# Patient Record
Sex: Male | Born: 1995
Health system: Southern US, Community
[De-identification: ages and names within clinical notes are randomized; demographics above are authoritative.]

## PROBLEM LIST (undated history)

## (undated) DIAGNOSIS — I10 Essential (primary) hypertension: Secondary | ICD-10-CM

## (undated) DIAGNOSIS — R569 Unspecified convulsions: Secondary | ICD-10-CM

---
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
## 2015-02-02 ENCOUNTER — Encounter: Payer: Self-pay | Admitting: Family

## 2015-03-02 ENCOUNTER — Other Ambulatory Visit: Payer: Self-pay | Admitting: Pediatrics

## 2015-03-20 ENCOUNTER — Ambulatory Visit: Payer: Managed Care, Other (non HMO) | Admitting: Family

## 2015-04-05 ENCOUNTER — Ambulatory Visit: Payer: Managed Care, Other (non HMO) | Admitting: Family

## 2015-04-17 ENCOUNTER — Ambulatory Visit: Payer: Managed Care, Other (non HMO) | Admitting: Family

## 2015-05-16 ENCOUNTER — Other Ambulatory Visit: Payer: Self-pay | Admitting: Family

## 2015-07-26 ENCOUNTER — Other Ambulatory Visit: Payer: Self-pay | Admitting: Family

## 2015-07-27 ENCOUNTER — Encounter: Payer: Self-pay | Admitting: Family

## 2015-08-07 ENCOUNTER — Ambulatory Visit (INDEPENDENT_AMBULATORY_CARE_PROVIDER_SITE_OTHER): Payer: 59 | Admitting: Family

## 2015-08-07 ENCOUNTER — Encounter: Payer: Self-pay | Admitting: Family

## 2015-08-07 VITALS — BP 120/72 | HR 100 | Ht 69.5 in | Wt 272.0 lb

## 2015-08-07 DIAGNOSIS — E669 Obesity, unspecified: Secondary | ICD-10-CM | POA: Diagnosis not present

## 2015-08-07 DIAGNOSIS — F70 Mild intellectual disabilities: Secondary | ICD-10-CM

## 2015-08-07 DIAGNOSIS — G40209 Localization-related (focal) (partial) symptomatic epilepsy and epileptic syndromes with complex partial seizures, not intractable, without status epilepticus: Secondary | ICD-10-CM

## 2015-08-07 DIAGNOSIS — R269 Unspecified abnormalities of gait and mobility: Secondary | ICD-10-CM

## 2015-08-07 NOTE — Progress Notes (Signed)
Patient: Isaiah Knapp MRN: [id number] Sex: male DOB: [birth year]  Provider: Elveria Rising, NP Location of Care: [facility]  Note type: Routine return visit  History of Present Illness: Referral Source: Hassell Halim, MD History from: mother, patient and [facility] chart Chief Complaint: Seizures  Isaiah Knapp is a 20 y.o. young man with history of prematurity, closed head injury at the age of 4 years and seizures. He was last seen July 14, 2014. Isaiah Knapp is taking and tolerating Levetiracetam for his seizure disorder. In the past, Isaiah Knapp was taking Carbatrol, but he was experiencing break through seizures despite having therapeutic Carbamazepine levels. He was transitioned to Levetiracetam and tapered slowly off Carbatrol. He had some breakthrough seizures in February 2015 and the Levetiracetam dose was increased.Cleophus's mother tells me today that he had a seizure on June 22, 2015 while on vacation in [place]. Mom said that the family got up at 3AM to drive to [place], and that she thought that he was having some "mini-seizures" on the way, but thought that when he arrived and was out of the car would feel better. She said that they drove for 10-11 hours. Isaiah Knapp seemed tired but excited to be with his family in [place]. Later that day, he had a greater than 3 minute seizure while watching his cousins play a video game. He was taken to a local ER for evaluation. Fortunately he was not injured during the course of the seizure. A CBC was performed in the ER, and Mom was told that the seizure likely occurred as a result of sleep deprivation. He went to his family's home, slept and did well for the remainder of the trip.   Isaiah Knapp has been otherwise healthy since last seen. He has continued to gain weight and is becoming quite obese. He is enrolled in the occupational course of study at school and doing well.   Neither Isaiah Knapp nor his mother have other health concerns for him today  other than previously mentioned.  Review of Systems: Please see the HPI for neurologic and other pertinent review of systems. Otherwise, the following systems are noncontributory including constitutional, eyes, ears, nose and throat, cardiovascular, respiratory, gastrointestinal, genitourinary, musculoskeletal, skin, endocrine, hematologic/lymph, allergic/immunologic and psychiatric.   Past Medical History  Diagnosis Date  . Seizure (HCC) 2001    from head trauma   Hospitalizations: Yes.  , Head Injury: No., Nervous System Infections: No., Immunizations up to date: Yes.   Past Medical History Comments: Patient was in NICU for 3 months after birth due to being born at 54 weeks. He suffered a head injury at the age of 4 jumping from a bed falling off and hitting his head on the bed frame, he was treated at [facility] in [place]. Patient suffered a head injury at the age of 20 years old from a fall and stayed for 3 to 4 days at [facility] in [place]. Records from [facility] in [place], [place] were reviewed. They mention that for the most part seizure control was good. He was prescribed both Tegretol and Carbatrol. He was noted to have developmental delay. Mention was made that he was followed by neurology. He had mild hyperreflexia in his lower extremities including ankle clonus he had tight Achilles tendons. EEG performed February 10, 2003 showed frequent independent and synchronous frontal polar spike and spike and wave discharges at times occurring on multiple occasions throughout each 10 second epoch Predominance shifted between hemispheres. At times they spread over  the paracentral regions posteriorly right greater than left. Background activity appeared otherwise normal. There were no photoparoxysmal response was. Seizure activity was more prominent when the patient was asleep than awake.  EEG performed July 31, 2011 with a normal record with the patient awake. He tapered  off Carbatrol in November 2014 after Levetiracetam was added to his regimen.   Surgical History No past surgical history on file.  Family History family history includes Breast cancer in his paternal grandmother; Heart attack in his maternal grandfather and paternal grandfather. Family History is otherwise negative for migraines, seizures, cognitive impairment, blindness, deafness, birth defects, chromosomal disorder, autism.  Social History Social History   Social History  . Marital Status: Single    Spouse Name: N/A  . Number of Children: N/A  . Years of Education: N/A   Social History Main Topics  . Smoking status: Never Smoker   . Smokeless tobacco: Never Used  . Alcohol Use: No  . Drug Use: No  . Sexual Activity: No   Other Topics Concern  . 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 He lives with his mother. He enjoys puzzles, lego building, and watching TV.    Allergies No Known Allergies  Physical Exam BP 120/72 mmHg  Pulse 100  Ht 5' 9.5" (1.765 m)  Wt 272 lb (123.378 kg)  BMI 39.60 kg/m2 General: alert, well developed, well nourished obese young man, in no acute distress, right-handed  Head: normocephalic, no dysmorphic features  Ears, Nose and Throat: Otoscopic: tympanic membranes normal . Pharynx: oropharynx is pink without exudates or tonsillar hypertrophy.  Neck: supple, full range of motion, no cranial or cervical bruits  Respiratory: auscultation clear  Cardiovascular: no murmurs, pulses are normal  Musculoskeletal: no apparent scoliosis; patient had tight heel cords bilaterally Skin: no rashes or neurocutaneous lesions   Neurologic Exam  Mental Status: alert; oriented to person, place, and year; knowledge is subnormal for age; language is normal.  Cranial Nerves: visual fields are full to double simultaneous stimuli; extraocular movements are  full and conjugate; pupils are round reactive to light; funduscopic examination shows sharp disc margins with normal vessels; symmetric facial strength; midline tongue and uvula; hearing is intact and symmetric  Motor: Normal strength, tone, and mass; good fine motor movements; no pronator drift.  Sensory: intact responses to touch and temperature  Coordination: good finger-to-nose, rapid repetitive alternating movements and finger apposition  Gait and Station: gait was slightly broad-based, valgus position of his feet; patient is able to walk on his toes better than his heels and tandem with mild difficulty; balance is poor; Romberg exam is negative Reflexes: symmetric and diminished bilaterally; no clonus; bilateral flexor plantar responses.   Impression 1. Complex partial seizures with secondary generalization 2. Mild intellectual disability 3. Abnormal gait 4. History of prematurity 5. History of closed head injury at age 23 years 6. Morbid obesity  Recommendations for plan of care The patient's previous [facility] records were reviewed. Rubel has neither had nor required imaging or lab studies since the last visit, other than a CBC at a hospital in [place] in November when he had a seizure while on vacation. He is a 20 year old young man with history of prematurity, closed head injury at 20 years of age and seizures. He is taking and tolerating Levetiracetam. He had a seizure in November while on vacation and in the setting of  sleep deprivation. I talked with Mom about that and will not increase his dose of Levetiracetam at this time. I asked Mom to let me know if he has any more seizures and I will reconsider this decision.   I continue to be concerned about Kelly's ongoing weight gain. I talked with Mom about his weight gain and my concerns about him developing chronic medical problems related to obesity. It is difficult to fully monitor his intake as he is fairly independent in activities of  daily living.  We talked about ways to limit his access to snacks and second portions, as well as increasing his activity.  Wt Readings from Last 3 Encounters:  08/07/15 272 lb (123.378 kg) (100 %*, Z = 2.71)  07/14/14 264 lb 12.8 oz (120.112 kg) (100 %*, Z = 2.63)  11/01/13 246 lb 6.4 oz (111.766 kg) (99 %*, Z = 2.41)   * Growth percentiles are based on CDC 2-20 Years data.   I will see [name] back in follow up in 1 year or sooner if needed. Mom agreed with the plans made today.   The medication list was reviewed and reconciled.  No changes were made in the prescribed medications today.  A complete medication list was provided to the patient's mother.  Total time spent with the patient was 20 minutes, of which 50% or more was spent in counseling and coordination of care.

## 2015-08-09 MED ORDER — LEVETIRACETAM 100 MG/ML PO SOLN: ORAL | Status: DC

## 2015-08-09 NOTE — Patient Instructions (Signed)
Isaiah Knapp should continue taking Levetiracetam 9036m in the morning and 36ml at night.   Let me know if he has any seizures.   Isaiah Knapp has gained considerable weight. He needs to limit snacks and portions, and be encouraged to be more active. I am concerned about him developing conditions such as diabetes, high cholesterol and high blood pressure, all of which can be related to excessive weight. I have printed his last 3 weights for you to see: Wt Readings from Last 3 Encounters:  08/07/15 272 lb (123.378 kg) (100 %*, Z = 2.71)  07/14/14 264 lb 12.8 oz (120.112 kg) (100 %*, Z = 2.63)  11/01/13 246 lb 6.4 oz (111.766 kg) (99 %*, Z = 2.41)   * Growth percentiles are based on CDC 2-20 Years data.    Please plan to return for follow up in 1 year or sooner if needed.

## 2015-11-08 ENCOUNTER — Telehealth: Payer: Self-pay

## 2015-11-08 DIAGNOSIS — G40209 Localization-related (focal) (partial) symptomatic epilepsy and epileptic syndromes with complex partial seizures, not intractable, without status epilepticus: Secondary | ICD-10-CM

## 2015-11-08 MED ORDER — LEVETIRACETAM 100 MG/ML PO SOLN: ORAL | Status: DC

## 2015-11-08 NOTE — Telephone Encounter (Signed)
Rx sent electronically. TG 

## 2015-11-08 NOTE — Telephone Encounter (Signed)
Mom called and lvm requesting Rx refill to be sent to the pharmacy. CB# [phone]  Lvm for mom letting her know that we received her request for refill on child's levetiracetam 100 mg/mL sig; Take 10 mL po q am and 10 mL po q pm, to be sent to [facility] on [address]. In the message I told he rot check with the pharmacy later today for the refill.

## 2016-08-01 ENCOUNTER — Ambulatory Visit (INDEPENDENT_AMBULATORY_CARE_PROVIDER_SITE_OTHER): Payer: 59 | Admitting: Family

## 2016-08-01 ENCOUNTER — Encounter (INDEPENDENT_AMBULATORY_CARE_PROVIDER_SITE_OTHER): Payer: Self-pay | Admitting: Family

## 2016-08-01 VITALS — BP 126/74 | HR 84 | Ht 69.75 in | Wt 275.2 lb

## 2016-08-01 DIAGNOSIS — R269 Unspecified abnormalities of gait and mobility: Secondary | ICD-10-CM | POA: Diagnosis not present

## 2016-08-01 DIAGNOSIS — E6609 Other obesity due to excess calories: Secondary | ICD-10-CM | POA: Diagnosis not present

## 2016-08-01 DIAGNOSIS — G40209 Localization-related (focal) (partial) symptomatic epilepsy and epileptic syndromes with complex partial seizures, not intractable, without status epilepticus: Secondary | ICD-10-CM

## 2016-08-01 DIAGNOSIS — F70 Mild intellectual disabilities: Secondary | ICD-10-CM

## 2016-08-01 MED ORDER — LEVETIRACETAM 100 MG/ML PO SOLN: ORAL | 3 refills | Status: DC

## 2016-08-01 NOTE — Patient Instructions (Signed)
I have sent in a new prescription to the pharmacy for the Levetiracetam with new instructions to take 12ml in the morning and 12ml at night. Let me know if [name] has any more seizures.   I have also given you a sample of the new "melt in the mouth" version of Levetiracetam called Spritam. Nilda SimmerGive Seanmichael 2 of the 500mg  and 1 of the 250mg  tablets at bedtime tonight to see if he likes it better than the liquid Levetiracetam. If he likes it better, we can send in a new prescription the next time he needs a refill.   Please plan on returning for follow up in 1 year or sooner if needed.

## 2016-08-01 NOTE — Progress Notes (Signed)
Patient: Isaiah Knapp MRN: [id number] Sex: male DOB: Mar 16, 1996  Provider: Elveria Rising, NP Location of Care: [facility]  Note type: Routine return visit  History of Present Illness: Referral Source: Kandyce Rud, MD History from: patient, [facility] chart and mother Chief Complaint: Epilepsy  Isaiah Knapp is a 21 y.o. boy with history of prematurity, closed head injury at the age of 4 years and seizures. He was last seen August 07, 2015.  Isaiah Knapp is taking and tolerating Levetiracetam for his seizure disorder. In the past, Isaiah Knapp was taking Carbatrol, but he was experiencing break through seizures despite having therapeutic Carbamazepine levels. He was transitioned to Levetiracetam and tapered slowly off Carbatrol. Mom tells me today that Isaiah Knapp was doing well until mid-December when he had a convulsive 3 minute seizure after 2 days of missed medication. The medication was restarted and he did well until January 2nd when he had 2 brief episodes of partial seizures. Mom said that he had not missed any medication prior to the January 2nd seizures and that he had not been sleep deprived.   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 He is doing well in this role.   Isaiah Knapp has been otherwise healthy since last seen. He has continued to gain weight and is quite obese. Neither Kamar nor his mother have other health concerns for him today other than previously mentioned.  Review of Systems: Please see the HPI for neurologic and other pertinent review of systems. Otherwise, the following systems are noncontributory including constitutional, eyes, ears, nose and throat, cardiovascular, respiratory, gastrointestinal, genitourinary, musculoskeletal, skin, endocrine, hematologic/lymph, allergic/immunologic and psychiatric.   Past Medical History:  Diagnosis Date  . Seizure (HCC) 2001   from head trauma   Hospitalizations: No., Head Injury:  No., Nervous System Infections: No., Immunizations up to date: Yes.   Past Medical History Comments: Patient was in NICU for 3 months after birth due to being born at 52 weeks. He suffered a head injury at the age of 4 jumping from a bed falling off and hitting his head on the bed frame, he was treated at [facility] in [place]. Patient suffered a head injury at the age of 21 years old from a fall and stayed for 3 to 4 days at [facility] in [place]. Records from [facility] in [place], [place] were reviewed. They mention that for the most part seizure control was good. He was prescribed both Tegretol and Carbatrol. He was noted to have developmental delay. Mention was made that he was followed by neurology. He had mild hyperreflexia in his lower extremities including ankle clonus he had tight Achilles tendons. EEG performed February 10, 2003 showed frequent independent and synchronous frontal polar spike and spike and wave discharges at times occurring on multiple occasions throughout each 10 second epoch Predominance shifted between hemispheres. At times they spread over the paracentral regions posteriorly right greater than left. Background activity appeared otherwise normal. There were no photoparoxysmal response was. Seizure activity was more prominent when the patient was asleep than awake.  EEG performed July 31, 2011 with a normal record with the patient awake. He tapered off Carbatrol in November 2014 after Levetiracetam was added to his regimen.   Surgical History History reviewed. No pertinent surgical history.  Family History family history includes Breast cancer in his paternal grandmother; Heart attack in his maternal grandfather and paternal grandfather. Family History is otherwise negative for migraines,  seizures, cognitive impairment, blindness, deafness, birth defects, chromosomal disorder, autism.  Social History Social History   Social History  . Marital  status: Single    Spouse name: N/A  . Number of children: N/A  . Years of education: N/A   Social History Main Topics  . Smoking status: Never Smoker  . Smokeless tobacco: Never Used  . Alcohol use No  . Drug use: No  . Sexual activity: No   Other Topics Concern  . None   Social History Narrative   Isaiah Knapp graduated from [employer] (internship); he does well in both. He is employed at [employer] as a Public affairs consultant. He lives with his mother. He enjoys puzzles, lego building, and watching TV.    Allergies No Known Allergies  Physical Exam BP 126/74   Pulse 84   Ht 5' 9.75" (1.772 m)   Wt 275 lb 3.2 oz (124.8 kg)   BMI 39.77 kg/m  General: alert, well developed, well nourished obese young man, in no acute distress, right-handed  Head: normocephalic, no dysmorphic features  Ears, Nose and Throat: Otoscopic: tympanic membranes normal . Pharynx: oropharynx is pink without exudates or tonsillar hypertrophy.  Neck: supple, full range of motion, no cranial or cervical bruits  Respiratory: auscultation clear  Cardiovascular: no murmurs, pulses are normal  Musculoskeletal: no apparent scoliosis; patient had tight heel cords bilaterally Skin: no rashes or neurocutaneous lesions   Neurologic Exam  Mental Status: alert; oriented to person, place, and year; knowledge is subnormal for age; language is normal but he has difficulty answering questions and looks to his mother for help Cranial Nerves: visual fields are full to double simultaneous stimuli; extraocular movements are full and conjugate; pupils are round reactive to light; funduscopic examination shows sharp disc margins with normal vessels; symmetric facial strength; midline tongue and uvula; hearing is intact and symmetric  Motor: Normal strength, tone, and mass; good fine motor movements; no pronator drift.  Sensory: intact responses to touch and temperature  Coordination: good finger-to-nose,  rapid repetitive alternating movements and finger apposition  Gait and Station: gait was slightly broad-based, valgus position of his feet; patient is able to walk on his toes better than his heels and tandem with mild difficulty; balance is poor; Romberg exam is negative Reflexes: symmetric and diminished bilaterally; no clonus; bilateral flexor plantar responses.   Impression 1. Complex partial seizures with secondary generalization 2. Mild intellectual disability 3. Abnormal gait 4. History of prematurity 5. History of closed head injury at age 37 years 6. Morbid obesity  Recommendations for plan of care The patient's previous [facility] records were reviewed. Isaiah Knapp has neither had nor required imaging or lab studies since the last visit. He is a 21 year old young man with history of prematurity, closed head injury at 21 years of age and seizures. He is taking and tolerating Levetiracetam. He had a convulsive seizure in December in the setting of missed medication and 2 partial seizures this week with no obvious triggers. I talked with Mom and recommended increasing the Levetiracetam dose. I will increase his dose from 10ml BID to 12ml BID. I also talked with her about trying a sample of Spritam, the orally dissolvable Levetiracetam to see if that will be easier for Isaiah Knapp since he takes a large dose of the liquid suspension. I gave Mom a sample of the Spritam and asked her to let me know if he liked it better than the liquid. I also asked her to let me  know if he has any more seizures.   I continue to be concerned about Isaiah Knapp's ongoing weight gain. I talked with Mom about his weight gain and my concerns about him developing chronic medical problems related to obesity. It is difficult to fully monitor his intake as he is fairly independent in activities of daily living.  We talked about ways to limit his access to snacks and second portions, as well as increasing his activity.  Wt Readings from Last 3  Encounters:  08/01/16 275 lb 3.2 oz (124.8 kg)  08/07/15 272 lb (123.4 kg) (>99 %, Z > 2.33)*  07/14/14 264 lb 12.8 oz (120.1 kg) (>99 %, Z > 2.33)*   * Growth percentiles are based on CDC 2-20 Years data.    The medication list was reviewed and reconciled.  No changes were made in the prescribed medications today.  A complete medication list was provided to the patient/caregiver. I will see [name] back in follow up in 1 year or sooner if needed.   Allergies as of 08/01/2016   No Known Allergies     Medication List       Accurate as of 08/01/16 11:59 PM. Always use your most recent med list.          levETIRAcetam 100 MG/ML solution Commonly known as:  KEPPRA TAKE 12 ML IN THE MORNING AND 12 ML IN THE EVENING       Dr. Sharene SkeansHickling was consulted regarding the patient.   Total time spent with the patient was 30 minutes, of which 50% or more was spent in counseling and coordination of care.   Elveria Risingina Dewitte Vannice NP-C

## 2016-08-06 ENCOUNTER — Telehealth (INDEPENDENT_AMBULATORY_CARE_PROVIDER_SITE_OTHER): Payer: Self-pay | Admitting: Family

## 2016-08-06 DIAGNOSIS — G40209 Localization-related (focal) (partial) symptomatic epilepsy and epileptic syndromes with complex partial seizures, not intractable, without status epilepticus: Secondary | ICD-10-CM

## 2016-08-06 MED ORDER — SPRITAM 250 MG PO TB3D: 1.0000 | ORAL_TABLET | Freq: Two times a day (BID) | ORAL | 5 refills | Status: DC

## 2016-08-06 MED ORDER — SPRITAM 1000 MG PO TB3D: 1.0000 | ORAL_TABLET | Freq: Two times a day (BID) | ORAL | 5 refills | Status: DC

## 2016-08-06 NOTE — Telephone Encounter (Signed)
Mom Arnoldo HookerKarla Francois called to say that [name] liked the Spritam sample better than the Levetiracetam suspension and wanted to get a prescription of the medication sent to the pharmacy. I sent in a prescription as requested. TG

## 2017-05-13 DIAGNOSIS — Z0100 Encounter for examination of eyes and vision without abnormal findings: Secondary | ICD-10-CM | POA: Diagnosis not present

## 2017-08-13 ENCOUNTER — Other Ambulatory Visit (INDEPENDENT_AMBULATORY_CARE_PROVIDER_SITE_OTHER): Payer: Self-pay | Admitting: Family

## 2017-08-13 DIAGNOSIS — G40209 Localization-related (focal) (partial) symptomatic epilepsy and epileptic syndromes with complex partial seizures, not intractable, without status epilepticus: Secondary | ICD-10-CM

## 2017-08-18 ENCOUNTER — Other Ambulatory Visit (INDEPENDENT_AMBULATORY_CARE_PROVIDER_SITE_OTHER): Payer: Self-pay | Admitting: Family

## 2017-08-18 DIAGNOSIS — G40209 Localization-related (focal) (partial) symptomatic epilepsy and epileptic syndromes with complex partial seizures, not intractable, without status epilepticus: Secondary | ICD-10-CM

## 2017-08-18 MED ORDER — KEPPRA 100 MG/ML PO SOLN: ORAL | 5 refills | Status: DC

## 2017-08-18 MED ORDER — SPRITAM 250 MG PO TB3D: 1.0000 | ORAL_TABLET | Freq: Two times a day (BID) | ORAL | 5 refills | Status: DC

## 2017-08-18 MED ORDER — SPRITAM 1000 MG PO TB3D: 1.0000 | ORAL_TABLET | Freq: Two times a day (BID) | ORAL | 5 refills | Status: DC

## 2017-08-18 NOTE — Telephone Encounter (Signed)
Rx has been printed and awaiting Tina's signature

## 2017-08-18 NOTE — Telephone Encounter (Signed)
Mother called and stated that the pharmacy did not carry the Spritam and they have decided to go back to the Leveiracetam suspension.  Please send in Leveiracetam to the pharmacy.

## 2017-08-18 NOTE — Telephone Encounter (Signed)
L/M informing mom that the prescriptions have been faxed over to the pharmacy

## 2017-08-18 NOTE — Telephone Encounter (Signed)
Spoke with Isaiah Knapp and she stated that she will send in the rx

## 2017-08-18 NOTE — Addendum Note (Signed)
Addended by: Princella IonGOODPASTURE, Trell Secrist P on: 08/18/2017 04:18 PM   Modules accepted: Orders

## 2017-08-18 NOTE — Telephone Encounter (Signed)
°  Who's calling (name and relationship to patient) : Mom/Karla  Best contact number: [phone]  Provider they see: Sula Sodaina G.  Reason for call: Mom called to sched f/u appt for 08/21/17, also stated that pt only has one dose left, needs refill as soon as possible please. Requested a call to confirm rx is sent to pharmacy please.      PRESCRIPTION REFILL ONLY  Name of prescription: LEVETIRACETAM  [facility] [address]

## 2017-08-20 ENCOUNTER — Encounter (INDEPENDENT_AMBULATORY_CARE_PROVIDER_SITE_OTHER): Payer: Self-pay | Admitting: Family

## 2017-08-20 ENCOUNTER — Ambulatory Visit (INDEPENDENT_AMBULATORY_CARE_PROVIDER_SITE_OTHER): Payer: 59 | Admitting: Family

## 2017-08-20 VITALS — BP 124/100 | HR 76 | Ht 70.0 in | Wt 287.6 lb

## 2017-08-20 DIAGNOSIS — G40209 Localization-related (focal) (partial) symptomatic epilepsy and epileptic syndromes with complex partial seizures, not intractable, without status epilepticus: Secondary | ICD-10-CM | POA: Diagnosis not present

## 2017-08-20 DIAGNOSIS — R030 Elevated blood-pressure reading, without diagnosis of hypertension: Secondary | ICD-10-CM

## 2017-08-20 MED ORDER — KEPPRA 100 MG/ML PO SOLN: ORAL | 5 refills | Status: DC

## 2017-08-20 NOTE — Patient Instructions (Signed)
Thank you for coming in today.   Instructions for you until your next appointment are as follows: 1. I have ordered an EEG to evaluate the seizures. I will call you when the EEG has been read and we will talk about a treatment plan at that time.  2. Increase the Keppra to 13 ml in the morning and 13ml in the evening.  3. Tannar's BP is higher than expected today on the bottom number (called diastolic). Please follow up with his PCP about this.  4. Please sign up for MyChart if you have not done so

## 2017-08-20 NOTE — Progress Notes (Signed)
Patient: Isaiah Knapp MRN: [id number] Sex: male DOB: 10-06-1995  Provider: Elveria Rising, NP Location of Care: [facility]  Note type: Routine return visit  History of Present Illness: Referral Source: Kandyce Rud, MD History from: mother, patient and [facility] chart Chief Complaint: Epilepsy  Isaiah Knapp is a 22 y.o. young man with history of prematurity, closed head injury at the age of 4 years, and seizures. He was last seen August 01, 2016. Isaiah Knapp was taking and tolerating Spritam for his seizure disorder but there were difficulties with the cost and availability of the medication. He was changed back to Keppra suspension as he is unable to swallow tablets. He used to take Carbatrol but it did not control his seizures. Mom tells me today that she sees fairly frequent seizures in which he stares and clenches his right hand. Afterward he is briefly dazed but is then able to continue his activities.   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 He has done well in this environment.   Isaiah Knapp has been otherwise healthy since he was last seen. Mom has no other health concerns for him today other than previously mentioned.   Review of Systems: Please see the HPI for neurologic and other pertinent review of systems. Otherwise, all other systems were reviewed and were negative.    Past Medical History:  Diagnosis Date  . Seizure (HCC) 2001   from head trauma   Hospitalizations: No., Head Injury: No., Nervous System Infections: No., Immunizations up to date: Yes.   Past Medical History Comments: Patient was in NICU for 3 months after birth due to being born at 101 weeks. He suffered a head injury at the age of 4 jumping from a bed falling off and hitting his head on the bed frame, he was treated at [facility] in [place]. Patient suffered a head injury at the age of 22 years old from a fall and stayed for 3 to 4 days at  [facility] in [place]. Records from [facility] in [place], [place] were reviewed. They mention that for the most part seizure control was good. He was prescribed both Tegretol and Carbatrol. He was noted to have developmental delay. Mention was made that he was followed by neurology. He had mild hyperreflexia in his lower extremities including ankle clonus he had tight Achilles tendons. EEG performed February 10, 2003 showed frequent independent and synchronous frontal polar spike and spike and wave discharges at times occurring on multiple occasions throughout each 10 second epoch Predominance shifted between hemispheres. At times they spread over the paracentral regions posteriorly right greater than left. Background activity appeared otherwise normal. There were no photoparoxysmal response was. Seizure activity was more prominent when the patient was asleep than awake.  EEG performed July 31, 2011 with a normal record with the patient awake. He tapered off Carbatrol in November 2014 after Keppra was added to his regimen.  Surgical History History reviewed. No pertinent surgical history.  Family History family history includes Breast cancer in his paternal grandmother; Heart attack in his maternal grandfather and paternal grandfather. Family History is otherwise negative for migraines, seizures, cognitive impairment, blindness, deafness, birth defects, chromosomal disorder, autism.  Social History Social History   Socioeconomic History  . Marital status: Single    Spouse name: None  . Number of children: None  . Years of education: None  . Highest education level: None  Social Needs  .  Financial resource strain: None  . Food insecurity - worry: None  . Food insecurity - inability: None  . Transportation needs - medical: None  . Transportation needs - non-medical: None  Occupational History  . None  Tobacco Use  . Smoking status: Never Smoker  . Smokeless tobacco:  Never Used  Substance and Sexual Activity  . Alcohol use: No  . Drug use: No  . Sexual activity: No  Other Topics Concern  . None  Social History Narrative   Brice graduated from [employer] (internship); he does well in both. He is employed at [employer] as a Public affairs consultant. He lives with his mother. He enjoys puzzles, lego building, and watching TV.    Allergies No Known Allergies  Physical Exam BP (!) 120/100   Pulse 76   Ht 5\' 10"  (1.778 m)   Wt 287 lb 9.6 oz (130.5 kg)   BMI 41.27 kg/m  General: well developed, well nourished obese male, seated on exam table, in no evident distress; black hair, brown eyes, right handed Head: normocephalic and atraumatic. Oropharynx benign. No dysmorphic features. Neck: supple with no carotid bruits. No focal tenderness. Cardiovascular: regular rate and rhythm, no murmurs. Respiratory: Clear to auscultation bilaterally Abdomen: Bowel sounds present all four quadrants, abdomen soft, non-tender, non-distended. No hepatosplenomegaly or masses palpated. Musculoskeletal: No skeletal deformities or obvious scoliosis Skin: no rashes or neurocutaneous lesions  Neurologic Exam Mental Status: Awake and fully alert.  Attention span and concentration appropriate for age. Fund of knowledge is subnormal for age. Speech fluent without dysarthria but he has difficulty answering questions and looks to his mother for help.   Able to follow commands and participate in examination. Cranial Nerves: Fundoscopic exam - red reflex present.  Unable to fully visualize fundus.  Pupils equal briskly reactive to light.  Extraocular movements full without nystagmus.  Visual fields full to confrontation.  Hearing intact and symmetric to whisper.  Facial sensation intact.  Face, tongue, palate move normally and symmetrically.  Neck flexion and extension normal. Motor: Normal bulk and tone.  Normal strength in all tested extremity muscles. Sensory:  Intact to touch and temperature in all extremities. Coordination: Rapid movements: finger and toe tapping normal and symmetric bilaterally.  Finger-to-nose and heel-to-shin intact bilaterally.  Able to balance on either foot. Romberg negative. Gait and Station: Arises from chair, without difficulty. Stance is broad based.  Gait demonstrates was broad based with valgus position of the feet. He could walk on his toes but had difficulty with heels. He could not perform tandem walk. Balance was adequate.  Reflexes: Diminished and symmetric. Toes downgoing. No clonus.  Impression 1.  Complex partial seizures with secondary generalization 2.  Intellectual disability 3.  Abnormal gait 4.  History of prematurity 5.  History of closed head injury at age 25 years 6. Morbid obesity  Recommendations for plan of care The patient's previous [facility] records were reviewed. Miquel has neither had nor required imaging or lab studies since the last visit. He is a 23 year old young man with history of prematurity, closed head injury at age 25 years, intellectual disability and seizures. He is taking and tolerating Keppra for his seizure disorder. Mom reports staring spells in which he also clenches his right hand. I recommended that the Keppra dose increase to 13ml twice per day and also recommended performing an EEG. I will call Mom after the EEG has been read and make a treatment plan at that time. Isaiah Knapp's BP was  elevated today, and I instructed Mom to contact his PCP about that. Mom agreed with the plans made today.   The medication list was reviewed and reconciled.  I reviewed changes that were made in the prescribed medications today.  A complete medication list was provided to the patient's mother.  Allergies as of 08/20/2017   No Known Allergies     Medication List        Accurate as of 08/20/17 11:59 PM. Always use your most recent med list.          KEPPRA 100 MG/ML solution Generic drug:   levETIRAcetam Take 13 ml by mouth in the morning and 13 ml by mouth at night       Dr. Sharene SkeansHickling was consulted regarding the patient.   Total time spent with the patient was 20 minutes, of which 50% or more was spent in counseling and coordination of care.   Elveria Risingina Espiridion Supinski NP-C

## 2017-08-21 ENCOUNTER — Encounter (INDEPENDENT_AMBULATORY_CARE_PROVIDER_SITE_OTHER): Payer: Self-pay | Admitting: Family

## 2017-08-25 DIAGNOSIS — R030 Elevated blood-pressure reading, without diagnosis of hypertension: Secondary | ICD-10-CM | POA: Diagnosis not present

## 2017-09-01 ENCOUNTER — Ambulatory Visit (INDEPENDENT_AMBULATORY_CARE_PROVIDER_SITE_OTHER): Payer: 59 | Admitting: Pediatrics

## 2017-09-01 DIAGNOSIS — G40309 Generalized idiopathic epilepsy and epileptic syndromes, not intractable, without status epilepticus: Secondary | ICD-10-CM

## 2017-09-01 DIAGNOSIS — G40209 Localization-related (focal) (partial) symptomatic epilepsy and epileptic syndromes with complex partial seizures, not intractable, without status epilepticus: Secondary | ICD-10-CM | POA: Diagnosis not present

## 2017-09-02 DIAGNOSIS — G40309 Generalized idiopathic epilepsy and epileptic syndromes, not intractable, without status epilepticus: Secondary | ICD-10-CM | POA: Insufficient documentation

## 2017-09-02 NOTE — Progress Notes (Signed)
Patient: Isaiah Knapp MRN: [id number] Sex: male DOB: October 18, 1995  Clinical History: Isaiah Knapp is a 22 y.o. with history of prematurity, closed head injury at 22 years of age and seizures.  He had been treated with Carbatrol but had breakthrough seizures despite having therapeutic carbamazepine levels.  He was transitioned to levetiracetam monotherapy in mid December he had a 3-minute generalized convulsive seizure after 2 days of missed medication.  Medicine was restarted and he did well until January 2 when he had 2 brief episodes of partial seizures.  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  The study is done to look for the presence of seizures..  Medications: levetiracetam (Keppra)  Procedure: The tracing is carried out on a 32-channel digital Natus recorder, reformatted into 16-channel montages with 1 devoted to EKG.  The patient was awake, drowsy and asleep during the recording.  The international 10/20 system lead placement used.  Recording time 27 minutes.   Description of Findings: Dominant frequency is 30 V, 11 hz, alpha range activity that is well modulated and well regulated, posteriorly and symmetrically distributed, and attenuates with eye-opening.    Background activity consists of less than 10 V alpha and beta range activity.  Patient becomes drowsy with mixed frequency theta and delta range activity and drifts into light natural sleep with vertex sharp waves and a paucity of sleep spindles before the study is stopped.  There was no interictal epileptiform activity in the form of spikes or sharp waves..  Activating procedures included intermittent photic stimulation, and hyperventilation.  Intermittent photic stimulation failed to induce a driving response.  Hyperventilation caused a 30 V lower theta per delta range activity prominent over the right more than the left hemisphere.  EKG showed a regular sinus rhythm.  Impression: This is a  normal record with the patient awake, drowsy and asleep.  A normal EEG does not rule out the presence of seizures.  Ellison CarwinWilliam Denai Caba, MD

## 2017-11-24 ENCOUNTER — Telehealth (INDEPENDENT_AMBULATORY_CARE_PROVIDER_SITE_OTHER): Payer: Self-pay | Admitting: Family

## 2017-11-24 NOTE — Telephone Encounter (Signed)
°  Who's calling (name and relationship to patient) : Paula Compton (Mother) Best contact number: [phone] Provider they see: Inetta Fermo Reason for call: Mom would like to discuss EEG results.

## 2017-11-24 NOTE — Telephone Encounter (Signed)
EEG was normal.  Apparently the patient had a seizure a couple weeks ago and lamotrigine level was increased.  I called mother to inform her of the results.  She thanked me and had no further questions.

## 2017-12-02 DIAGNOSIS — R030 Elevated blood-pressure reading, without diagnosis of hypertension: Secondary | ICD-10-CM | POA: Diagnosis not present

## 2018-04-10 ENCOUNTER — Other Ambulatory Visit (INDEPENDENT_AMBULATORY_CARE_PROVIDER_SITE_OTHER): Payer: Self-pay | Admitting: Pediatrics

## 2018-04-10 DIAGNOSIS — G40209 Localization-related (focal) (partial) symptomatic epilepsy and epileptic syndromes with complex partial seizures, not intractable, without status epilepticus: Secondary | ICD-10-CM

## 2018-06-08 DIAGNOSIS — Z0100 Encounter for examination of eyes and vision without abnormal findings: Secondary | ICD-10-CM | POA: Diagnosis not present

## 2018-06-15 DIAGNOSIS — Z23 Encounter for immunization: Secondary | ICD-10-CM | POA: Diagnosis not present

## 2018-06-15 DIAGNOSIS — G40909 Epilepsy, unspecified, not intractable, without status epilepticus: Secondary | ICD-10-CM | POA: Diagnosis not present

## 2018-06-15 DIAGNOSIS — Z0000 Encounter for general adult medical examination without abnormal findings: Secondary | ICD-10-CM | POA: Diagnosis not present

## 2018-09-29 ENCOUNTER — Telehealth (INDEPENDENT_AMBULATORY_CARE_PROVIDER_SITE_OTHER): Payer: Self-pay | Admitting: Family

## 2018-09-29 DIAGNOSIS — G40209 Localization-related (focal) (partial) symptomatic epilepsy and epileptic syndromes with complex partial seizures, not intractable, without status epilepticus: Secondary | ICD-10-CM

## 2018-09-29 MED ORDER — LEVETIRACETAM 100 MG/ML PO SOLN: ORAL | 0 refills | Status: DC

## 2018-09-29 NOTE — Telephone Encounter (Signed)
L/M informing mom that refills were sent to the pharmacy requested. Also informed her that she needed to call back to schedule a follow up appointment for patient.

## 2018-09-29 NOTE — Telephone Encounter (Signed)
°  Who's calling (name and relationship to patient) : Paula Compton (Mother)  Best contact number: [phone] Provider they see: Inetta Fermo Reason for call: Mom would [facility] [address] [facility] the [facility] order pharmacy ([employer] care insurance). Mom also requesting for pt to have generic brand of Keppra instead of name brand.      PRESCRIPTION REFILL ONLY  Name of prescription: All  Pharmacy: [facility] [phone]

## 2018-09-29 NOTE — Telephone Encounter (Signed)
Please let Mom know that I sent the Rx for generic Keppra to [facility] as requested. Also let her know that it is time for Alvis to come in for revisit. Thanks [employer]

## 2018-09-29 NOTE — Telephone Encounter (Signed)
-----   Message from Elveria Rising, NP sent at 09/29/2018  9:21 AM EST ----- Regarding: Needs appointment Denny Peon needs an appointment with me.  Thanks, Inetta Fermo

## 2018-10-12 ENCOUNTER — Telehealth (INDEPENDENT_AMBULATORY_CARE_PROVIDER_SITE_OTHER): Payer: Self-pay | Admitting: Family

## 2018-10-12 NOTE — Telephone Encounter (Signed)
L/M requesting a call back from mom with an explanation of why the appointment needs to be cancelled for tomorrow.

## 2018-10-12 NOTE — Telephone Encounter (Signed)
°  Who's calling (name and relationship to patient) : Bozarth,Karla V Best contact number: [phone] Provider they see: [place] Reason for call: Mom would like to reschedule Isaiah Knapp's appt tomorrow with Inetta Fermo but before doing so she would like to make sure Inetta Fermo feels ok with this.  Please call    PRESCRIPTION REFILL ONLY  Name of prescription:  Pharmacy:

## 2018-10-12 NOTE — Telephone Encounter (Signed)
This appt was rescheduled per Harland Dingwall.  Mom will call if there are any concerns.

## 2018-10-13 ENCOUNTER — Ambulatory Visit (INDEPENDENT_AMBULATORY_CARE_PROVIDER_SITE_OTHER): Payer: 59 | Admitting: Family

## 2018-11-24 ENCOUNTER — Ambulatory Visit (INDEPENDENT_AMBULATORY_CARE_PROVIDER_SITE_OTHER): Payer: 59 | Admitting: Family

## 2018-11-24 ENCOUNTER — Other Ambulatory Visit: Payer: Self-pay

## 2018-11-24 DIAGNOSIS — G40309 Generalized idiopathic epilepsy and epileptic syndromes, not intractable, without status epilepticus: Secondary | ICD-10-CM

## 2018-11-24 DIAGNOSIS — F70 Mild intellectual disabilities: Secondary | ICD-10-CM

## 2018-11-24 DIAGNOSIS — G40209 Localization-related (focal) (partial) symptomatic epilepsy and epileptic syndromes with complex partial seizures, not intractable, without status epilepticus: Secondary | ICD-10-CM | POA: Diagnosis not present

## 2018-11-24 DIAGNOSIS — E6609 Other obesity due to excess calories: Secondary | ICD-10-CM | POA: Diagnosis not present

## 2018-11-24 MED ORDER — LEVETIRACETAM 100 MG/ML PO SOLN: ORAL | 3 refills | Status: DC

## 2018-11-24 NOTE — Progress Notes (Signed)
This is a Pediatric Specialist E-Visit follow up consult provided via WebEx Isaiah Knapp and his mother Isaiah Knapp consented to an E-Visit consult today.  Location of patient: Isaiah Knapp is at home Location of provider: Damita Dunnings is at NP-C Patient was referred by Isaiah Rud, MD   The following participants were involved in this E-Visit: patient, his mother, NP  Chief Complain/ Reason for E-Visit today: seizure follow up Total time on call: 8 min Follow up: 1 year      Isaiah Knapp   MRN:  [id number]  Oct 01, 1995   Provider: Elveria Rising NP-C Location of Care: [facility]  Visit type: Return visit   Last visit: 08/20/17  Referral source: Isaiah Rud, MD History from: patient's mother and [facility] chart  Brief history:  History of prematurity, closed head injury at age of 4 years, and seizures. He is taking and tolerating Levetiracetam suspension. 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 An EEG was performed after his last visit that was normal awake, drowsy and asleep.   Today's concerns:Marland Kitchen  Mom reports today that Isaiah Knapp has been doing well since his last visit. He has remained seizure free on Levetiracetam. He is somewhat restless at home during the Covid 19 restrictions and is looking forward to returning to his job at [employer].   Review of systems: Please see HPI for neurologic and other pertinent review of systems. Otherwise all other systems were reviewed and were negative.  Problem List: Patient Active Problem List   Diagnosis Date Noted  . Epilepsy, generalized, convulsive (HCC) 09/02/2017  . Obese 11/01/2013  . Partial epilepsy with impairment of consciousness (HCC) 11/26/2012  . Abnormality of gait 11/26/2012  . Mild intellectual disability 11/26/2012  . Long-term use of high-risk medication 11/26/2012     Past Medical History:  Diagnosis Date  .  Seizure (HCC) 2001   from head trauma    Past medical history comments: See HPI Copied from previous record: Patient was in NICU for 3 months after birth due to being born at 47 weeks. He suffered a head injury at the age of 4 jumping from a bed falling off and hitting his head on the bed frame, he was treated at [facility] in [place]. Patient suffered a head injury at the age of 23 years old from a fall and stayed for 3 to 4 days at [facility] in [place]. Records from [facility] in [place], [place] were reviewed. They mention that for the most part seizure control was good. He was prescribed both Tegretol and Carbatrol. He was noted to have developmental delay. Mention was made that he was followed by neurology. He had mild hyperreflexia in his lower extremities including ankle clonus he had tight Achilles tendons. EEG performed February 10, 2003 showed frequent independent and synchronous frontal polar spike and spike and wave discharges at times occurring on multiple occasions throughout each 10 second epoch Predominance shifted between hemispheres. At times they spread over the paracentral regions posteriorly right greater than left. Background activity appeared otherwise normal. There were no photoparoxysmal response was. Seizure activity was more prominent when the patient was asleep than awake.  EEG performed July 31, 2011 with a normal record with the patient awake. He tapered off Carbatrol in November 2014 after Keppra was added to his regimen.  Surgical history: No past surgical history on file.   Family  history: family history includes Breast cancer in his paternal grandmother; Heart attack in his maternal grandfather and paternal grandfather.   Social history: Social History   Socioeconomic History  . Marital status: Single    Spouse name: Not on file  . Number of children: Not on file  . Years of education: Not on file  . Highest education level: Not on  file  Occupational History  . Not on file  Social Needs  . Financial resource strain: Not on file  . Food insecurity:    Worry: Not on file    Inability: Not on file  . Transportation needs:    Medical: Not on file    Non-medical: Not on file  Tobacco Use  . Smoking status: Never Smoker  . Smokeless tobacco: Never Used  Substance and Sexual Activity  . Alcohol use: No  . Drug use: No  . Sexual activity: Never  Lifestyle  . Physical activity:    Days per week: Not on file    Minutes per session: Not on file  . Stress: Not on file  Relationships  . Social connections:    Talks on phone: Not on file    Gets together: Not on file    Attends religious service: Not on file    Active member of club or organization: Not on file    Attends meetings of clubs or organizations: Not on file    Relationship status: Not on file  . Intimate partner violence:    Fear of current or ex partner: Not on file    Emotionally abused: Not on file    Physically abused: Not on file    Forced sexual activity: Not on file  Other Topics Concern  . Not on file  Social History Narrative   Isaiah Knapp graduated from [employer] (internship); he does well in both. 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 He lives with his mother. He enjoys puzzles, lego building, and watching TV.      Past/failed meds: Carbatrol failed to control seizures  Allergies: No Known Allergies    Immunizations:  There is no immunization history on file for this patient.    Diagnostics/Screenings: EEG 09/01/17 - normal awake, asleep and drowsy   Physical Exam: There were no vitals taken for this visit.  Examination was not performed because of technical difficulties with Webex in that I could not see the patient.   Impression: 1. Complex partial seizures with secondary generalization 2.  Intellectual disability 3.  Abnormal gait 4.  History of prematurity 5. History of closed head  injury at age 41 years 6. Morbid obesity   Recommendations for plan of care: [facility] Isaiah Knapp has neither had nor required imaging or lab studies since the last visit. He is a 23 year old young man with history of prematurity, closed head injury at age of 4 years, intellectual disability and seizures. He is taking and tolerating Levetiracetam and has remained seizure free since the dose was increased after the last visit. I talked with Jarquavious's mother about helping him to stay on a sleep schedule while he is not working due to [employer]ov[employer] 19 restrictions, as sleep deprivation is known to trigger seizures. I will see him back in follow up in 1 year or sooner if needed.   The medication list was reviewed and reconciled. No changes were made in the prescribed medications today. A complete medication list was provided to the  patient.  Allergies as of 11/24/2018   No Known Allergies     Medication List       Accurate as of November 24, 2018  8:27 AM. Always use your most recent med list.        levETIRAcetam 100 MG/ML solution Commonly known as:  Keppra TAKE 13 MLS BY MOUTH IN THE MORNING AND 13 MLS BY MOUTH AT NIGHT        Total time spent with the patient was 8 minutes, of which 50% or more was spent in counseling and coordination of care.  Elveria Rising NP-C [facility] Ph. [phone] Fax [phone]

## 2018-11-24 NOTE — Patient Instructions (Signed)
Thank you for meeting with me by Webex today.   Instructions for you until your next appointment are as follows: 1. Continue Isaiah Knapp's medication as you have been giving it 2. Let me know if he has any seizures 3. Please sign up for MyChart if you have not done so 4. Please plan to return for follow up in one year or sooner if needed.

## 2018-11-26 ENCOUNTER — Encounter (INDEPENDENT_AMBULATORY_CARE_PROVIDER_SITE_OTHER): Payer: Self-pay | Admitting: Family

## 2018-12-16 DIAGNOSIS — Z131 Encounter for screening for diabetes mellitus: Secondary | ICD-10-CM | POA: Diagnosis not present

## 2018-12-16 DIAGNOSIS — R030 Elevated blood-pressure reading, without diagnosis of hypertension: Secondary | ICD-10-CM | POA: Diagnosis not present

## 2018-12-24 ENCOUNTER — Other Ambulatory Visit (INDEPENDENT_AMBULATORY_CARE_PROVIDER_SITE_OTHER): Payer: Self-pay | Admitting: Family

## 2018-12-24 DIAGNOSIS — G40209 Localization-related (focal) (partial) symptomatic epilepsy and epileptic syndromes with complex partial seizures, not intractable, without status epilepticus: Secondary | ICD-10-CM

## 2018-12-24 MED ORDER — LEVETIRACETAM 100 MG/ML PO SOLN: ORAL | 3 refills | Status: DC

## 2019-03-09 ENCOUNTER — Emergency Department (HOSPITAL_COMMUNITY)
Admission: EM | Admit: 2019-03-09 | Discharge: 2019-03-09 | Disposition: A | Discharge status: Home / Self Care | Payer: 59 | Attending: Emergency Medicine | Admitting: Emergency Medicine

## 2019-03-09 ENCOUNTER — Other Ambulatory Visit: Payer: Self-pay

## 2019-03-09 ENCOUNTER — Encounter (HOSPITAL_COMMUNITY): Payer: Self-pay | Admitting: Emergency Medicine

## 2019-03-09 ENCOUNTER — Emergency Department (HOSPITAL_COMMUNITY): Payer: 59

## 2019-03-09 ENCOUNTER — Telehealth (INDEPENDENT_AMBULATORY_CARE_PROVIDER_SITE_OTHER): Payer: Self-pay | Admitting: Family

## 2019-03-09 DIAGNOSIS — M542 Cervicalgia: Secondary | ICD-10-CM | POA: Diagnosis not present

## 2019-03-09 DIAGNOSIS — Z23 Encounter for immunization: Secondary | ICD-10-CM | POA: Insufficient documentation

## 2019-03-09 DIAGNOSIS — Y999 Unspecified external cause status: Secondary | ICD-10-CM | POA: Insufficient documentation

## 2019-03-09 DIAGNOSIS — W1830XA Fall on same level, unspecified, initial encounter: Secondary | ICD-10-CM | POA: Diagnosis not present

## 2019-03-09 DIAGNOSIS — R569 Unspecified convulsions: Secondary | ICD-10-CM | POA: Diagnosis present

## 2019-03-09 DIAGNOSIS — Y93E1 Activity, personal bathing and showering: Secondary | ICD-10-CM | POA: Diagnosis not present

## 2019-03-09 DIAGNOSIS — S0181XA Laceration without foreign body of other part of head, initial encounter: Secondary | ICD-10-CM | POA: Diagnosis not present

## 2019-03-09 DIAGNOSIS — Y92012 Bathroom of single-family (private) house as the place of occurrence of the external cause: Secondary | ICD-10-CM | POA: Insufficient documentation

## 2019-03-09 DIAGNOSIS — W19XXXA Unspecified fall, initial encounter: Secondary | ICD-10-CM

## 2019-03-09 HISTORY — DX: Essential (primary) hypertension: I10

## 2019-03-09 LAB — CBC WITH DIFFERENTIAL/PLATELET
Abs Immature Granulocytes: 0.11 10*3/uL — ABNORMAL HIGH (ref 0.00–0.07)
Basophils Absolute: 0.1 10*3/uL (ref 0.0–0.1)
Basophils Relative: 0 %
Eosinophils Absolute: 0.3 10*3/uL (ref 0.0–0.5)
Eosinophils Relative: 2 %
HCT: 49.4 % (ref 39.0–52.0)
Hemoglobin: 16 g/dL (ref 13.0–17.0)
Immature Granulocytes: 1 %
Lymphocytes Relative: 16 %
Lymphs Abs: 2.4 10*3/uL (ref 0.7–4.0)
MCH: 26 pg (ref 26.0–34.0)
MCHC: 32.4 g/dL (ref 30.0–36.0)
MCV: 80.3 fL (ref 80.0–100.0)
Monocytes Absolute: 0.7 10*3/uL (ref 0.1–1.0)
Monocytes Relative: 5 %
Neutro Abs: 11.6 10*3/uL — ABNORMAL HIGH (ref 1.7–7.7)
Neutrophils Relative %: 76 %
Platelets: 346 10*3/uL (ref 150–400)
RBC: 6.15 MIL/uL — ABNORMAL HIGH (ref 4.22–5.81)
RDW: 13.1 % (ref 11.5–15.5)
WBC: 15.1 10*3/uL — ABNORMAL HIGH (ref 4.0–10.5)
nRBC: 0 % (ref 0.0–0.2)

## 2019-03-09 LAB — COMPREHENSIVE METABOLIC PANEL
ALT: 44 U/L (ref 0–44)
AST: 29 U/L (ref 15–41)
Albumin: 3.9 g/dL (ref 3.5–5.0)
Alkaline Phosphatase: 67 U/L (ref 38–126)
Anion gap: 12 (ref 5–15)
BUN: 10 mg/dL (ref 6–20)
CO2: 20 mmol/L — ABNORMAL LOW (ref 22–32)
Calcium: 9 mg/dL (ref 8.9–10.3)
Chloride: 104 mmol/L (ref 98–111)
Creatinine, Ser: 0.87 mg/dL (ref 0.61–1.24)
GFR calc Af Amer: >60 mL/min (ref >60)
GFR calc non Af Amer: >60 mL/min (ref >60)
Glucose, Bld: 118 mg/dL — ABNORMAL HIGH (ref 70–99)
Potassium: 4.2 mmol/L (ref 3.5–5.1)
Sodium: 136 mmol/L (ref 135–145)
Total Bilirubin: 0.4 mg/dL (ref 0.3–1.2)
Total Protein: 7.9 g/dL (ref 6.5–8.1)

## 2019-03-09 MED ORDER — TETANUS-DIPHTH-ACELL PERTUSSIS 5-2.5-18.5 LF-MCG/0.5 IM SUSP
0.5000 mL | Freq: Once | INTRAMUSCULAR | Status: AC
  Administered 2019-03-09: 0.5 mL via INTRAMUSCULAR
  Filled 2019-03-09: qty 0.5

## 2019-03-09 MED ORDER — LEVETIRACETAM 100 MG/ML PO SOLN
1300.0000 mg | Freq: Once | ORAL | Status: AC
  Administered 2019-03-09: 08:00:00 1300 mg via ORAL
  Filled 2019-03-09: qty 15

## 2019-03-09 MED ORDER — ACETAMINOPHEN 500 MG PO TABS
1000.0000 mg | ORAL_TABLET | Freq: Once | ORAL | Status: AC
  Administered 2019-03-09: 1000 mg via ORAL
  Filled 2019-03-09: qty 2

## 2019-03-09 MED ORDER — SODIUM CHLORIDE 0.9 % IV BOLUS
1000.0000 mL | Freq: Once | INTRAVENOUS | Status: AC
  Administered 2019-03-09: 08:00:00 1000 mL via INTRAVENOUS

## 2019-03-09 NOTE — Telephone Encounter (Signed)
I called and talked to Mom. She said that the seizure this morning in the bathtub lasted several minutes and was convulsive. He has some cuts and bruises from his fall in the tub. I offered sooner appointment for tomorrow and Mom accepted appointment with Dr Gaynell Face tomorrow morning at 11:15AM. TG

## 2019-03-09 NOTE — ED Notes (Signed)
EDP at bedside  

## 2019-03-09 NOTE — ED Notes (Signed)
Patient is now back in bed with call bell in reach and family at bedside ?

## 2019-03-09 NOTE — ED Notes (Signed)
Walked patient to the bathroom patient did well family at bedside

## 2019-03-09 NOTE — ED Triage Notes (Signed)
Per [facility] pt coming from home after having witnessed episode in tub. Mother states she heard a fall and found patient on bottom of tub. EMS states patient has been postictal since they arrived. Patient slightly combative with EMS. Patient hit head, has abrasions to left eye. Patient alert to verbal stimuli.

## 2019-03-09 NOTE — ED Notes (Signed)
Family at bedside. 

## 2019-03-09 NOTE — ED Notes (Signed)
Patient transported to CT 

## 2019-03-09 NOTE — Telephone Encounter (Signed)
°  Who's calling (name and relationship to patient) : Florentina Jenny (mom)  Best contact number: [phone]  Provider they see: Cloretta Ned  Reason for call: Mom LVM patient had seizure in bath tube.  He went to the ED and had two more seizure at the ED.  She would like to let Otila Kluver know and to schedule an appt this week to adjust medication.  Please call.     PRESCRIPTION REFILL ONLY  Name of prescription:  Pharmacy:

## 2019-03-09 NOTE — ED Provider Notes (Signed)
[facility] EM[facility]GENCY [facility] Provider Note   CSN: [id number] Arrival date & time: 03/09/19  [id number]    History   Chief Complaint Chief Complaint  Patient presents with   Seizures    HPI Isaiah Knapp is a 23 y.o. male.     HPI  Patient presents via EMS for evaluation after a seizure.  He was taking a shower this morning.  His mother had involved with her.  Found him to be seizing with generalized tonic-clonic movements.  EMS called.  By the time they arrived, seizure had abated.  Patient was postictal, not following commands, drowsy for them.  Small laceration to the left forehead and eyelid.  No interventions given in route.  On my exam patient is able to provide history.  Reports that he has felt well lately.  Had not taken his Keppra this morning.  No complaints now.   Past Medical History:  Diagnosis Date   Hypertension    Seizure (HCC) 2001   from head trauma    Patient Active Problem List   Diagnosis Date Noted   Epilepsy, generalized, convulsive (HCC) 09/02/2017   Obese 11/01/2013   Partial epilepsy with impairment of consciousness (HCC) 11/26/2012   Abnormality of gait 11/26/2012   Mild intellectual disability 11/26/2012   Long-term use of high-risk medication 11/26/2012    History reviewed. No pertinent surgical history.      Home Medications    Prior to Admission medications   Medication Sig Start Date End Date Taking? Authorizing Provider  amLODipine (NORVASC) 5 MG tablet Take 5 mg by mouth every morning. 12/16/18  Yes [provider]  levETIRAcetam (KEPPRA) 100 MG/ML solution TAKE 13 MLS BY MOUTH IN THE MORNING AND 13 MLS BY MOUTH AT NIGHT 12/24/18  Yes Goodpasture, Inetta Fermoina, NP  Multiple Vitamins-Minerals (HM MULTIVITAMIN ADULT GUMMY PO) Take 1 each by mouth every morning.   Yes [provider]    Family History Family History  Problem Relation Age of Onset   Heart attack Maternal Grandfather        Died  at the age of 23   Heart attack Paternal Grandfather    Breast cancer Paternal Grandmother     Social History Social History   Tobacco Use   Smoking status: Never Smoker   Smokeless tobacco: Never Used  Substance Use Topics   Alcohol use: No   Drug use: No     Allergies   Patient has no known allergies.   Review of Systems Review of Systems  Neurological: Positive for seizures.  All other systems reviewed and are negative.    Physical Exam Updated Vital Signs BP 135/81    Pulse (!) 109    Temp 98.7 F (37.1 C) (Axillary)    Resp (!) 29    SpO2 94%   Physical Exam Vitals signs and nursing note reviewed.  Constitutional:      Appearance: He is well-developed.  HENT:     Head: Atraumatic.     Comments: Several millimeter laceration to left forehead, several faint/superficial linear lacerations over the left eyelid, all hemostatic. Eyes:     Extraocular Movements: Extraocular movements intact.     Pupils: Pupils are equal, round, and reactive to light.     Comments: Conjunctival injection bilaterally  Neck:     Musculoskeletal: Neck supple. Muscular tenderness present.     Comments: Tenderness to palpation left of midline but no bony tenderness with palpation or flexion, extension, rotation Cardiovascular:  Rate and Rhythm: Regular rhythm. Tachycardia present.     Heart sounds: No murmur.  Pulmonary:     Effort: Pulmonary effort is normal. No respiratory distress.     Breath sounds: Normal breath sounds.  Abdominal:     Palpations: Abdomen is soft.     Tenderness: There is no abdominal tenderness.  Skin:    General: Skin is warm and dry.  Neurological:     Mental Status: He is alert.      ED Treatments / Results  Labs (all labs ordered are listed, but only abnormal results are displayed) Labs Reviewed  COMPREHENSIVE METABOLIC PANEL - Abnormal; Notable for the following components:      Result Value   CO2 20 (*)    Glucose, Bld 118 (*)     All other components within normal limits  CBC WITH DIFFERENTIAL/PLATELET - Abnormal; Notable for the following components:   WBC 15.1 (*)    RBC 6.15 (*)    Neutro Abs 11.6 (*)    Abs Immature Granulocytes 0.11 (*)    All other components within normal limits    EKG EKG Interpretation  Date/Time:  Tuesday March 09 2019 07:19:01 EDT Ventricular Rate:  116 PR Interval:    QRS Duration: 79 QT Interval:  302 QTC Calculation: 420 R Axis:   83 Text Interpretation:  Sinus tachycardia Borderline repolarization abnormality no significant change since 2013 Confirmed by Pricilla LovelessGoldston, Scott [phone]([id number]) on 03/09/2019 8:06:23 AM   Radiology Ct Head Wo Contrast  Result Date: 03/09/2019 CLINICAL DATA:  Seizure with fall EXAM: CT HEAD WITHOUT CONTRAST TECHNIQUE: Contiguous axial images were obtained from the base of the skull through the vertex without intravenous contrast. COMPARISON:  None. FINDINGS: Brain: Ventricles are normal in size and configuration. There is a focal area of calcification along the posterior falx on the right measuring 1.3 x 0.8 cm. There is no mass effect or surrounding edema from this calcification. No similar calcification elsewhere. There is no mass, hemorrhage, extra-axial fluid collection, or midline shift. The brain parenchyma appears unremarkable. No evident acute infarct. Vascular: No hyperdense vessel.  No evident vascular calcification. Skull: Bony calvarium appears intact. Sinuses/Orbits: Orbits appear symmetric bilaterally. There is mucosal thickening in several ethmoid air cells. Other paranasal sinuses are clear. Other: Mastoid air cells are clear. IMPRESSION: 1. Brain parenchyma appears unremarkable. No mass effect or hemorrhage. Calcification along the right tentorium appears benign without mass effect or edema. 2.  There is mucosal thickening in several ethmoid air cells. Electronically Signed   By: Bretta BangWilliam  Woodruff III M.D.   On: 03/09/2019 13:22     Procedures Procedures (including critical care time)  Medications Ordered in ED Medications  sodium chloride 0.9 % bolus 1,000 mL (0 mLs Intravenous Stopped 03/09/19 0833)  levETIRAcetam (KEPPRA) 100 MG/ML solution 1,300 mg (1,300 mg Oral Given 03/09/19 0829)  acetaminophen (TYLENOL) tablet 1,000 mg (1,000 mg Oral Given 03/09/19 0742)  Tdap (BOOSTRIX) injection 0.5 mL (0.5 mLs Intramuscular Given 03/09/19 0831)     Initial Impression / Assessment and Plan / ED Course  I have reviewed the triage vital signs and the nursing notes.  Pertinent labs & imaging results that were available during my care of the patient were reviewed by me and considered in my medical decision making (see chart for details).        Isaiah Knapp is a 23 year old male with a history of complex partial seizures with secondary generalization for which he is followed by pediatric neurology.  Last appointment was 4 months ago.  He has been on Keppra for some time.  I reviewed his medical records.  Here today for seizure as above.  Occurred in the shower and resultant facial injury with a small laceration.  He is unable to recall his last tetanus and I do not see document in the chart.  Tetanus updated accordingly.  He is tachycardic on arrival but otherwise behaving appropriately.  Gave 1 L bolus of crystalloid and EKG obtained which showed sinus tachycardia with a rate of 116.  No signs of acute ischemia, other dysrhythmia, or abnormal intervals.  Given Keppra per his regular home regimen.  Continue to monitor.  He had an additional episode of being unresponsive to his mother for several seconds followed by grunting.  I was present at bedside as it resolved.  He quickly returned to baseline within just a few minutes.  She reports this is consistent with his regular seizures.  Given this event following head trauma, proceeded with a head CT.  This was ultimately unremarkable for intracranial injury.  After additional  observation the patient had no other episodes.  Felt well.  Had normal neurologic exam performed by myself.  For his lacerations, they are very superficial.  I do not think that stitching or gluing would improve healing.  Gave standard wound care aftercare instructions and return precautions.  Okay for discharge.  They will follow-up with Ms. Cloretta Ned their nurse practitioner in the neurology clinic.   Final Clinical Impressions(s) / ED Diagnoses   Final diagnoses:  Seizure [facility])  Fall, initial encounter  Facial laceration, initial encounter    ED Discharge Orders    None       Tillie Fantasia, MD 03/09/19 0940    Sherwood Gambler, MD 03/10/19 [id number]

## 2019-03-09 NOTE — Telephone Encounter (Signed)
Noted, thank you

## 2019-03-09 NOTE — ED Notes (Signed)
Pt mother came out stating pt was having another seizure. This RN to the  Bedside. No seizure activity noted. Pt alert and oriented X4. Pt in NAD.

## 2019-03-10 ENCOUNTER — Ambulatory Visit (INDEPENDENT_AMBULATORY_CARE_PROVIDER_SITE_OTHER): Payer: 59 | Admitting: Pediatrics

## 2019-03-10 ENCOUNTER — Encounter (INDEPENDENT_AMBULATORY_CARE_PROVIDER_SITE_OTHER): Payer: Self-pay | Admitting: Pediatrics

## 2019-03-10 VITALS — BP 124/86 | HR 100 | Ht 69.75 in | Wt 281.6 lb

## 2019-03-10 DIAGNOSIS — G40209 Localization-related (focal) (partial) symptomatic epilepsy and epileptic syndromes with complex partial seizures, not intractable, without status epilepticus: Secondary | ICD-10-CM | POA: Diagnosis not present

## 2019-03-10 DIAGNOSIS — G40309 Generalized idiopathic epilepsy and epileptic syndromes, not intractable, without status epilepticus: Secondary | ICD-10-CM

## 2019-03-10 DIAGNOSIS — F70 Mild intellectual disabilities: Secondary | ICD-10-CM

## 2019-03-10 DIAGNOSIS — Z6841 Body Mass Index (BMI) 40.0 and over, adult: Secondary | ICD-10-CM

## 2019-03-10 MED ORDER — LEVETIRACETAM 100 MG/ML PO SOLN: ORAL | 3 refills | Status: DC

## 2019-03-10 NOTE — Patient Instructions (Signed)
I am concerned that seizures continue once a month or more often and that he had a rather serious seizure causing him to fall forward.  Fortunately he was not injured.  Levetiracetam will be increased to 15 mL or 1500 mg twice daily.  I have written a new prescription.  We will also perform an EEG that you will need to schedule as you leave today.  We will plan to see him in 2 months.  Please sign up for my chart so that you let me know how he is responding to the increased dose and whether we continue to see seizures.  I have talked about switching him to oxcarbazepine (tradename Trileptal) or lamotrigine (tradename Lamictal).  There are other options but these are the best to.

## 2019-03-10 NOTE — Progress Notes (Signed)
Patient: Isaiah Knapp MRN: [id number] Sex: male DOB: [birth year]  Provider: Ellison CarwinWilliam Lilinoe Acklin, MD Location of Care: [facility]  Note type: Routine return visit  History of Present Illness: Referral Source: Kandyce RudMarcus Babaoff, MD History from: mother, patient and [facility] chart Chief Complaint: Epilepsy  Isaiah Knapp is a 23 y.o. male who returns on March 10, 2019, for the first time since November 24, 2018.  The patient has focal epilepsy with impairment of consciousness and secondary generalization.  He suffered a closed head injury at 23 years of age and as a result of his head injury, developed a seizure disorder.  Seizures have been in fairly good control until recently.  About once a month, he has 1 or more brief complex partial seizures.  These have not been under complete control in a long time.    Yesterday, he was brought to the hospital after he suffered a generalized tonic-clonic seizure at home and fell in the shower.  He had some lacerations and bruises.  He was noted to have a generalized tonic-clonic seizure.  He arrived to the hospital postictal and was treated with fluids.  He had a sinus tachycardia.  He was given Keppra because he had not yet taken his morning dose.  He had a CT scan of the brain which was unremarkable.  He had an episode of being unresponsive to his mother for several seconds followed by grunting.  It resolved as the physician came to the bedside.  The patient returned to baseline.  There may have been another event, but it was not documented.  Mother said that he had episodes of moaning and rocking back and forth.  This is not clearly an ictal behavior.  He has fallen down the stairs a few months ago as a result of his seizure.  His only other medical problem is hypertension.  He goes to bed around 8 p.m. and sleeps until 5:50 a.m.  He has previously worked at [employer] but has been laid off because of the pandemic.  I asked him to come in today  because it has been some time since I have evaluated him and it has been a long time since he has had an EEG.  Currently, he takes 1300 mg of levetiracetam twice daily.  We could easily increase it to 1500 mg twice daily.  I would not advocate pushing the dose beyond that point.  Review of Systems: A complete review of systems was remarkable for mom reports that the patient had a seizure in the shower yesterday morning. She states that it was a gran mal. She states that he fell face forward in the wall. He was unconscious. She states that once in the hospital he had two there that consisted of humming and rocking. She states that he has small seizures at least once a month. No other concerns at this time., all other systems reviewed and negative.  Past Medical History Diagnosis Date   Hypertension    Seizure (HCC) 2001   from head trauma   Hospitalizations: No., Head Injury: No., Nervous System Infections: No., Immunizations up to date: Yes.    Copied from previous record: Patient was in NICU for 3 months after birth due to being born at 3528 weeks.   He suffered a head injury at the age of 4 jumping from a bed falling off and hitting his head on the bed frame, he was [facility] at [facility] [place]. Patient suffered a  head injury at the age of 23 years old from a fall and stayed for 3 to 4 days at [facility] in [place].   Records from [facility] in [place], [place] were reviewed. They mention that for the most part seizure control was good. He was prescribed both Tegretol and Carbatrol. He was noted to have developmental delay. Mention was made that he was followed by neurology. He had mild hyperreflexia in his lower extremities including ankle clonus he had tight Achilles tendons.   EEG performed February 10, 2003 showed frequent independent and synchronous frontal polar spike and spike and wave discharges at times occurring on multiple occasions throughout each 10  second epoch Predominance shifted between hemispheres. At times they spread over the paracentral regions posteriorly right greater than left. Background activity appeared otherwise normal. There were no photoparoxysmal response was. Seizure activity was more prominent when the patient was asleep than awake.   EEG performed July 31, 2011 with a normal record with the patient awake. He tapered off Carbatrol in November 2014 afterKepprawas added to his regimen.  EEG September 01, 2017 was normal record awake, drowsy, and asleep.  A normal EEG does not rule out the presence of seizures.  Behavior History none  Surgical History History reviewed. No pertinent surgical history.  Family History family history includes Breast cancer in his paternal grandmother; Heart attack in his maternal grandfather and paternal grandfather. Family history is negative for migraines, seizures, intellectual disabilities, blindness, deafness, birth defects, chromosomal disorder, or autism.  Social History Social Designer, fashion/clothing strain: Not on file   Food insecurity    Worry: Not on file    Inability: Not on file   Transportation needs    Medical: Not on file    Non-medical: Not on file  Tobacco Use   Smoking status: Never Smoker   Smokeless tobacco: Never Used  Substance and Sexual Activity   Alcohol use: No   Drug use: No   Sexual activity: Never  Social History Narrative    [name] graduated from [employer] (internship); he does well in both. 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 He lives with his mother. He enjoys puzzles, lego building, and watching TV.   No Known Allergies  Physical Exam BP 124/86    Pulse 100    Ht 5' 9.75" (1.772 m)    Wt 281 lb 9.6 oz (127.7 kg)    BMI 40.70 kg/m   General: alert, well developed, well nourished, in no acute distress, brown hair, brown eyes, right handed Head: normocephalic, no dysmorphic  features Ears, Nose and Throat: Otoscopic: tympanic membranes normal; pharynx: oropharynx is pink without exudates or tonsillar hypertrophy Neck: supple, full range of motion, no cranial or cervical bruits Respiratory: auscultation clear Cardiovascular: no murmurs, pulses are normal Musculoskeletal: no skeletal deformities or apparent scoliosis Skin: no rashes or neurocutaneous lesions  Neurologic Exam  Mental Status: alert; oriented to person, place and year; knowledge is normal for age; language is normal Cranial Nerves: visual fields are full to double simultaneous stimuli; extraocular movements are full and conjugate; pupils are round reactive to light; funduscopic examination shows sharp disc margins with normal vessels; symmetric facial strength; midline tongue and uvula; air conduction is greater than bone conduction bilaterally Motor: Normal strength, tone and mass; good fine motor movements; no pronator drift Sensory: intact responses to cold, vibration, proprioception and stereognosis Coordination: good finger-to-nose, rapid repetitive alternating movements and finger apposition Gait and  Station: normal gait and station: patient is able to walk on heels, toes and tandem without difficulty; balance is adequate; Romberg exam is negative; Gower response is negative Reflexes: symmetric and diminished bilaterally; no clonus; bilateral flexor plantar responses  Assessment 1.  Partial epilepsy with impairment of consciousness, G40.209. 2.  Epilepsy, generalized, convulsive, G40.309. 3.  Mild intellectual disability, F70. 4.  Severe obesity due to excess of calories without known comorbidity, BMI 40.0-40.9.  Discussion I am concerned about the frequency of complex partial seizures but also the emergence of generalized convulsive seizures for the first time in a long while.  If levetiracetam fails, we will need to consider medicines like oxcarbazepine and lamotrigine.  Plan Prescription  was issued for levetiracetam 15 mL twice daily.  [name] will return to see me in 2 months' time.  Greater than 50% of a 25-minute visit was spent in counseling and coordination of care.  In the interim, between now and when we see him again, we will have an EEG performed to see if there has been any significant change.  It was last performed in 2013.  We may need to consider lamotrigine or oxcarbazepine if levetiracetam fails.  He was on carbamazepine a long time ago and is no longer on that medication.  Greater than 50% of a 25-minute visit was spent in counseling and coordination of care concerning his seizures and treatment alternatives.   Medication List   Accurate as of March 10, 2019 11:59 PM. If you have any questions, ask your nurse or doctor.    amLODipine 5 MG tablet Commonly known as: NORVASC Take 5 mg by mouth every morning.   HM MULTIVITAMIN ADULT GUMMY PO Take 1 each by mouth every morning.   levETIRAcetam 100 MG/ML solution Commonly known as: Keppra TAKE 15 MLS BY MOUTH IN THE MORNING AND 15 MLS BY MOUTH AT NIGHT What changed: additional instructions Changed by: Ellison CarwinWilliam Gates Jividen, MD    The medication list was reviewed and reconciled. All changes or newly prescribed medications were explained.  A complete medication list was provided to the patient/caregiver.  Deetta PerlaWilliam H Davaun Quintela MD

## 2019-03-11 ENCOUNTER — Ambulatory Visit (INDEPENDENT_AMBULATORY_CARE_PROVIDER_SITE_OTHER): Payer: 59 | Admitting: Family

## 2019-03-22 ENCOUNTER — Ambulatory Visit (INDEPENDENT_AMBULATORY_CARE_PROVIDER_SITE_OTHER): Payer: 59 | Admitting: Pediatrics

## 2019-03-22 ENCOUNTER — Other Ambulatory Visit: Payer: Self-pay

## 2019-03-22 DIAGNOSIS — G40209 Localization-related (focal) (partial) symptomatic epilepsy and epileptic syndromes with complex partial seizures, not intractable, without status epilepticus: Secondary | ICD-10-CM

## 2019-03-22 DIAGNOSIS — G40309 Generalized idiopathic epilepsy and epileptic syndromes, not intractable, without status epilepticus: Secondary | ICD-10-CM

## 2019-03-22 NOTE — Progress Notes (Addendum)
OP EEG completed in office, results pending.

## 2019-03-23 NOTE — Progress Notes (Signed)
Patient: Isaiah Knapp MRN: [id number] Sex: male DOB: 18-Sep-1995  Clinical History: Promise is a 23 y.o. with focal epilepsy with impairment of consciousness evolving to secondary generalized seizures.  He suffered a closed head injury at 23 years of age and developed seizures subsequently.  His seizures have been in relatively good control until recently.  Once a month he has 1 or more brief complex partial seizures.  He has also recently experienced a generalized tonic-clonic seizure on March 09, 2019.  The study is performed to look for the presence of seizure activity.  Medications: levetiracetam (Keppra)  Procedure: The tracing is carried out on a 32-channel digital Natus recorder, reformatted into 16-channel montages with 1 devoted to EKG.  The patient was awake and drowsy during the recording.  The international 10/20 system lead placement used.  Recording time 31.1 minutes.   Description of Findings: Dominant frequency is 35 V, 10 hz, alpha range activity that is well modulated and well regulated, posteriorly and symmetrically distributed, and attenuates with eye opening.    Background activity consists of 8 Hz 20 to 25 V alpha and beta range activity.  The patient becomes drowsy with rhythmic theta and upper delta range activity.  He briefly drifts into natural sleep with vertex sharp waves and low voltage symmetric sleep spindles.  The most striking finding in the record was right frontotemporal diphasic sharply contoured slow waves that became more prominent in drowsiness and the natural sleep.  These were interictal.  There was no change in clinical behavior.Marland Kitchen  Activating procedures included intermittent photic stimulation, and hyperventilation.  Intermittent photic stimulation failed to induce a driving response.  Hyperventilation caused no significant change in background.  EKG showed a regular sinus rhythm with a ventricular response of 72 beats per minute.  Impression: This is a  abnormal record with the patient awake, drowsy and asleep.  The presence of right frontotemporal diphasic sharp wave activity is epileptogenic from electrographic viewpoint and would correlate with the presence of a focal epilepsy with or without secondary generalization.  Wyline Copas, MD

## 2019-04-29 ENCOUNTER — Telehealth (INDEPENDENT_AMBULATORY_CARE_PROVIDER_SITE_OTHER): Payer: Self-pay | Admitting: Pediatrics

## 2019-04-29 NOTE — Telephone Encounter (Signed)
°  Who's calling (name and relationship to patient) : Florentina Jenny (Mother) Best contact number: [phone] Provider they see: Dr. Gaynell Face  Reason for call: Mom would like to discuss pt's EEG results and would also like a return call from Tiffanie regarding questions she has about a SCAT form for pt.

## 2019-04-29 NOTE — Telephone Encounter (Signed)
I left a message for mother to call. 

## 2019-04-29 NOTE — Telephone Encounter (Signed)
Mom called, there have been two more focal seizures with impairment of consciousness in September.  He need to have a new medication.  I asked Mom to call for an appointment.

## 2019-04-29 NOTE — Telephone Encounter (Signed)
Spoke with mom about her phone message. She would like the results of the EEG that was done. She will also be dropping off a form for SCAT to be completed as well.

## 2019-04-29 NOTE — Telephone Encounter (Signed)
L/M requesting a call back from mom to discuss her phone message 

## 2019-05-10 ENCOUNTER — Encounter (INDEPENDENT_AMBULATORY_CARE_PROVIDER_SITE_OTHER): Payer: Self-pay | Admitting: Pediatrics

## 2019-05-10 ENCOUNTER — Other Ambulatory Visit: Payer: Self-pay

## 2019-05-10 ENCOUNTER — Telehealth (INDEPENDENT_AMBULATORY_CARE_PROVIDER_SITE_OTHER): Payer: Self-pay | Admitting: Radiology

## 2019-05-10 ENCOUNTER — Ambulatory Visit (INDEPENDENT_AMBULATORY_CARE_PROVIDER_SITE_OTHER): Payer: 59 | Admitting: Pediatrics

## 2019-05-10 VITALS — BP 102/70 | HR 68 | Ht 70.0 in | Wt 282.0 lb

## 2019-05-10 DIAGNOSIS — G40309 Generalized idiopathic epilepsy and epileptic syndromes, not intractable, without status epilepticus: Secondary | ICD-10-CM

## 2019-05-10 DIAGNOSIS — G40209 Localization-related (focal) (partial) symptomatic epilepsy and epileptic syndromes with complex partial seizures, not intractable, without status epilepticus: Secondary | ICD-10-CM

## 2019-05-10 MED ORDER — LEVETIRACETAM ER 750 MG PO TB24: ORAL_TABLET | ORAL | 3 refills | Status: DC

## 2019-05-10 NOTE — Patient Instructions (Signed)
We are going to try the extended release preparation of levetiracetam to see if this works better for your early morning seizures.  Is going to take at least a week or so after you start the medicine to see if that works.  As soon as you start the extended release levetiracetam stop your liquid but do not throw it away.  I written for 90-day supply we will see how this works we talked about medicines oxcarbazepine and lamotrigine both of which come and immediate release and extended release tablets.  I talked about benefits and side effects and the fact that we will have to do blood monitoring with both of these other medicines.  Please come and see me in 3 months' time.  Please keep in touch with me through my chart so that I am aware of how you are doing

## 2019-05-10 NOTE — Telephone Encounter (Signed)
I filled out the form to the best of my ability.  I asked mother to check it over when she comes to pick it up to make certain that I did not have any errors.  I will be happy to correct any before she takes it.

## 2019-05-10 NOTE — Telephone Encounter (Signed)
  Who's calling (name and relationship to patient) : Baran Kuhrt - Mom   Best contact number: [phone]  Provider they see: Dr Gaynell Face   Reason for call:  Mom dropped off the SCAT form for [facility]. Please complete, if possible, and return to front desk. Front desk will advise mom when this is ready for pick up. Placed in Dr. Melanee Left box for review.    PRESCRIPTION REFILL ONLY  Name of prescription:  Pharmacy:

## 2019-05-10 NOTE — Progress Notes (Signed)
Patient: Isaiah Knapp MRN: [id number] Sex: male DOB: 24-Aug-1995  Provider: Ellison CarwinWilliam Hickling, MD [facility]  Note type: Routine return visit  History of Present Illness: Referral Source: Kandyce RudMarcus Babaoff, MD History from: mother, patient and [facility] chart Chief Complaint: Epilepsy  Isaiah Knapp is a 23 y.o. male who returns on May 10, 2019, for the first time since March 10, 2019.  The patient has focal epilepsy with impairment of consciousness with occasional secondary generalization.  He suffered a closed head injury at 23 years of age.  It appears that this was responsible for his seizure disorder.  Seizures have been in good control until recently.    He experienced 3 seizures in the past 2 weeks.  They usually happen in the morning either before or just after he has taken his medication.  He rocks back and forth while humming during his seizures.  He is unresponsive.  He also clenches his hands.  EEG performed on March 22, 2019, was abnormal and showed right frontotemporal diphasic sharply contoured slow-wave activity.  Background was otherwise normal and did not show focal slowing in that region.  The interictal activity became more prominent in drowsiness and natural sleep.  This study was performed in response to a generalized tonic-clonic seizure of March 09, 2019.    Levetiracetam has been steadily increased.  Currently, he takes 1500 mg twice daily.  The only way that we could continue to use this medication and possibly have a better result would be to switch from liquid levetiracetam to extended-release levetiracetam.  The other alternatives would include oxcarbazepine (he previously failed carbamazepine) and lamotrigine.  I discussed the benefits and side effects of those medications.  The mother settled after discussion on trying extended-release levetiracetam.  The EEG performed in 2020 was substantially different from that in 2004 (which  showed independent and synchronous bifrontal-polar foci spreading to the para-central region with shifting emphasis between hemispheres) and showed abnormalities that were not evident in 2013 and 2019.  His only imaging study in our electronic medical record was a CT scan of the head which occurred on March 09, 2019, when he was seen in the emergency department for generalized seizure.  Brain parenchyma was unremarkable.  There was calcification along the posterior falx on the right, measuring 1.3 x 0.8 cm.  This is of no clinical significance.  [name] has been compliant with his medication.  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  He works 4 days a week, 7 hours a day.  This is just below what would allow him to get benefits.  His seizures have not kept him from going to work.  His sleep is normal.  His appetite is big, but his weight is virtually identical with that 2 months ago.  Review of Systems: A complete review of systems was remarkable for mom reports that the patient has had three seizures in the last two weeks. She reports that he clinches his hands, hums and rocks back and forth. She states the seizures usually happens in the morning. No other concerns at this time., all other systems reviewed and negative.  Past Medical History Diagnosis Date  . Hypertension   . Seizure (HCC) 2001   from head trauma   Hospitalizations: No., Head Injury: No., Nervous System Infections: No., Immunizations up to date: Yes.    Copied from previous record: Patient was in NICU for 3 months after birth due to being born  at 28 weeks.   He suffered a head injury at the age of 74 jumping from a bed falling off and hitting his head on the bed frame, he was treated at [facility] in [place]. Patient suffered a head injury at the age of 23 years old from a fall and stayed for 3 to 4 days at [facility] in [place].   Records from [facility] in [place],  [place] were reviewed. They mention that for the most part seizure control was good. He was prescribed both Tegretol and Carbatrol. He was noted to have developmental delay. Mention was made that he was followed by neurology. He had mild hyperreflexia in his lower extremities including ankle clonus he had tight Achilles tendons.   EEG performed February 10, 2003 showed frequent independent and synchronous frontal polar spike and spike and wave discharges at times occurring on multiple occasions throughout each 10 second epoch Predominance shifted between hemispheres. At times they spread over the paracentral regions posteriorly right greater than left. Background activity appeared otherwise normal. There were no photoparoxysmal response was. Seizure activity was more prominent when the patient was asleep than awake.   EEG performed July 31, 2011 with a normal record with the patient awake. He tapered off Carbatrol in November 2014 afterKepprawas added to his regimen.  EEG September 01, 2017 was normal record awake, drowsy, and asleep.  A normal EEG does not rule out the presence of seizures.  CT brain March 09, 2019 showed normal brain with a calcification along the right tentorial falx 1.3 x 0.8 cm.  EEG March 22, 2019 showed right frontotemporal diphasic sharply contoured slow wave activity.  Background activity was otherwise normal with the patient awake, drowsy, and asleep.  Behavior History none  Surgical History History reviewed. No pertinent surgical history.  Family History family history includes Breast cancer in his paternal grandmother; Heart attack in his maternal grandfather and paternal grandfather. Family history is negative for migraines, seizures, intellectual disabilities, blindness, deafness, birth defects, chromosomal disorder, or autism.  Social History Socioeconomic History  . Marital status: Single  . Years of education:  42  . Highest education level:  High  school certificate  Occupational History  .  Dishwasher at [employer]  . Financial resource strain: Not on file  . Food insecurity    Worry: Not on file    Inability: Not on file  . Transportation needs    Medical: Not on file    Non-medical: Not on file  Tobacco Use  . Smoking status: Never Smoker  . Smokeless tobacco: Never Used  Substance and Sexual Activity  . Alcohol use: No  . Drug use: No  . Sexual activity: Never  Social History Narrative    Ayush graduated from [employer] (internship); he does well in both. 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 He lives with his mother. He enjoys puzzles, lego building, and watching TV.   No Known Allergies  Physical Exam BP 102/70   Pulse 68   Ht 5\' 10"  (1.778 m)   Wt 282 lb (127.9 kg)   BMI 40.46 kg/m   General: alert, well developed, obese, in no acute distress, brown hair, brown eyes, right handed Head: normocephalic, no dysmorphic features Ears, Nose and Throat: Otoscopic: tympanic membranes normal; pharynx: oropharynx is pink without exudates or tonsillar hypertrophy Neck: supple, full range of motion, no cranial or cervical bruits Respiratory: auscultation clear Cardiovascular: no  murmurs, pulses are normal Musculoskeletal: no skeletal deformities or apparent scoliosis Skin: no rashes or neurocutaneous lesions  Neurologic Exam  Mental Status: alert; oriented to person, place and year; knowledge is below normal for age; language is normal Cranial Nerves: visual fields are full to double simultaneous stimuli; extraocular movements are full and conjugate; pupils are round reactive to light; funduscopic examination shows sharp disc margins with normal vessels; symmetric facial strength; midline tongue and uvula; air conduction is greater than bone conduction bilaterally Motor: Normal strength, tone and mass; good fine motor movements; no pronator drift  Sensory: intact responses to cold, vibration, proprioception and stereognosis Coordination: good finger-to-nose, rapid repetitive alternating movements and finger apposition Gait and Station: normal gait and station: patient is able to walk on heels, toes and tandem without difficulty; balance is adequate; Romberg exam is negative; Gower response is negative Reflexes: symmetric and diminished bilaterally; no clonus; bilateral flexor plantar responses  Assessment 1. Partial epilepsy with impairment of consciousness, G40.209. 2. Epilepsy, generalized, convulsive, G40.309. 3. Mild intellectual disability, F70. 4. Severe obesity due to excess calories, BMI 40.0 to 40.9, E66.01.  Discussion Talor's seizures are happening in the early morning when levetiracetam levels would be at their lowest.  Plan We are going to switch to 750 mg extended-release levetiracetam.  The patient will take 2 twice daily.  We will observe his response.  An order for a 90-day supply was sent to [facility].  Once this returns, we will stop the liquid and start the tablets.  I gave mother some ideas about how she might successfully get him to take those tablets.  I asked her to keep in touch with me concerning his response to the medication both in terms of side effects and whether or not it seemed to control his seizures.  He will return to see me in 3 months' time.  I will see him sooner based on clinical need.  Greater than 50% of a 25-minute visit was spent in counseling and coordination of care concerning his seizures and discussing benefits and side effects of 3 different treatment alternatives, which include levetiracetam extended release, and starting either oxcarbazepine or lamotrigine.  I answered mother's questions in detail.   Medication List   Accurate as of May 10, 2019 11:59 PM. If you have any questions, ask your nurse or doctor.      TAKE these medications   amLODipine 5 MG tablet Commonly known as:  NORVASC Take 5 mg by mouth every morning.   HM MULTIVITAMIN ADULT GUMMY PO Take 1 each by mouth every morning.   Levetiracetam 750 MG Tb24 Take 2 tablets twice daily Replaces: levETIRAcetam 100 MG/ML solution Started by: Ellison Carwin, MD    The medication list was reviewed and reconciled. All changes or newly prescribed medications were explained.  A complete medication list was provided to the patient/caregiver.  Deetta Perla MD

## 2019-08-10 ENCOUNTER — Encounter (INDEPENDENT_AMBULATORY_CARE_PROVIDER_SITE_OTHER): Payer: Self-pay | Admitting: Pediatrics

## 2019-08-10 ENCOUNTER — Other Ambulatory Visit: Payer: Self-pay

## 2019-08-10 ENCOUNTER — Ambulatory Visit (INDEPENDENT_AMBULATORY_CARE_PROVIDER_SITE_OTHER): Payer: 59 | Admitting: Pediatrics

## 2019-08-10 VITALS — BP 130/78 | HR 88 | Ht 70.0 in | Wt 283.8 lb

## 2019-08-10 DIAGNOSIS — G40309 Generalized idiopathic epilepsy and epileptic syndromes, not intractable, without status epilepticus: Secondary | ICD-10-CM

## 2019-08-10 DIAGNOSIS — G40209 Localization-related (focal) (partial) symptomatic epilepsy and epileptic syndromes with complex partial seizures, not intractable, without status epilepticus: Secondary | ICD-10-CM

## 2019-08-10 DIAGNOSIS — Z6841 Body Mass Index (BMI) 40.0 and over, adult: Secondary | ICD-10-CM

## 2019-08-10 DIAGNOSIS — F70 Mild intellectual disabilities: Secondary | ICD-10-CM | POA: Diagnosis not present

## 2019-08-10 NOTE — Patient Instructions (Signed)
We will see you in 6 months time.  I will see you sooner if you have breakthrough seizures.  At that time we will likely add a new medication.  Keep taking the medication like you have been doing.  Continue your physical activity which I think is working.  Please let me know if there is anything in the interim that I can do to help.  I hope the Covid test is negative and the to get back to work soon.

## 2019-08-10 NOTE — Progress Notes (Signed)
Patient: Isaiah Knapp MRN: [id number] Sex: male DOB: 1996-04-07  Provider: Wyline Copas, MD Location of Care: [facility]  Note type: Routine return visit  History of Present Illness: Referral Source: Isaiah Late, MD History from: mother, patient and Isaiah Knapp chart Chief Complaint: Epilepsy  Isaiah Knapp is a 25 y.o. male who returns August 10, 2019 for the first time since May 10, 2019.  Isaiah Knapp has focal epilepsy with impairment of consciousness and occasional secondary generalization.  He suffered a closed head injury at 24 years of age.  This may be responsible for his seizure disorder.  His seizures have been in good control until recently.  In October, 2020 he experienced 3 seizures in 2 weeks.  These usually happen in the morning either just before or just after he is taken his medicine when his drug levels are low.  He was noted to rock back and forth humming during his seizures.  He is unresponsive.  He clenches his hands.  EEG March 22, 2019 showed right frontotemporal diphasic sharply contoured slow wave activity in a background that was otherwise normal.  Interictal activity became more prominent in drowsiness and natural sleep.  He had a generalized tonic-clonic seizure July 11, 2019.  My office was not contacted.  This lasted 2 to 3 minutes.  It was symmetric and generalized.  He slept for 3 hours after that and awakened at baseline.  He was switched from liquid levetiracetam to levetiracetam XR 750 mg 2 tablets twice daily.  In general this is better seizure control than in he experienced in October.  He takes and tolerates lamotrigine without significant side effects.  In general his health is good.  He goes to bed around 8 PM falls asleep quickly and sleeps soundly until 5:50 AM.  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  He has to go in today for a Covid test.  The hope is that the students  will be returning to school and he will be rehired.  He tells me that he walks on a treadmill 50 minutes a day, 5 days a week.  He also walks outside his home and does chores.  I think that he is very bored at this time.  Review of Systems: A complete review of systems was remarkable for patient is here to be seen for epilepsy. Isaiah Knapp reports that the patient had a seizure on December 13th. She reports that he was in the bed and it lasted for two to three minutes. She states that this one the only seizure since starting his new medication. Se reports no oter seizures at this time. No concerns reported., all other systems reviewed and negative.  Past Medical History Diagnosis Date  . Hypertension   . Seizure ([place]) 2001   from head trauma   Hospitalizations: No., Head Injury: No., Nervous System Infections: No., Immunizations up to date: Yes.    Copied from previous record: Patient was in NICU for 3 months after birth due to being born at 72 weeks.   He suffered a head injury at the age of 33 jumping from a bed falling off and hitting his head on the bed frame, he was treated at [facility] in [place]. Patient suffered a head injury at the age of 24 years old from a fall and stayed for 3 to 4 days at [facility] in [place].   Records from [facility] in  [place], [place] were reviewed. They mention that for the most part seizure control was good. He was prescribed both Tegretol and Carbatrol. He was noted to have developmental delay. Mention was made that he was followed by neurology. He had mild hyperreflexia in his lower extremities including ankle clonus he had tight Achilles tendons.   EEG performed February 10, 2003 showed frequent independent and synchronous frontal polar spike and spike and wave discharges at times occurring on multiple occasions throughout each 10 second epoch Predominance shifted between hemispheres. At times they spread over the paracentral regions  posteriorly right greater than left. Background activity appeared otherwise normal. There were no photoparoxysmal response was. Seizure activity was more prominent when the patient was asleep than awake.   EEG performed July 31, 2011 with a normal record with the patient awake. He tapered off Carbatrol in November 2014 afterKepprawas added to his regimen.  EEG September 01, 2017 was normal record awake, drowsy, and asleep. A normal EEG does not rule out the presence of seizures.  CT brain March 09, 2019 showed normal brain with a calcification along the right tentorial falx 1.3 x 0.8 cm.  EEG March 22, 2019 showed right frontotemporal diphasic sharply contoured slow wave activity.  Background activity was otherwise normal with the patient awake, drowsy, and asleep  Behavior History none  Surgical History History reviewed. No pertinent surgical history.  Family History family history includes Breast cancer in his paternal grandmother; Heart attack in his maternal grandfather and paternal grandfather. Family history is negative for migraines, seizures, intellectual disabilities, blindness, deafness, birth defects, chromosomal disorder, or autism.  Social History Socioeconomic History  . Marital status: Single  . Years of education:  31  . Highest education level:  High school graduate  Occupational History  . Not currently employed  Tobacco Use  . Smoking status: Never Smoker  . Smokeless tobacco: Never Used  Substance and Sexual Activity  . Alcohol use: No  . Drug use: No  . Sexual activity: Never  Social History Narrative    [name] graduated from [employer] (internship); he does well in both. He is employed at [employer] as a Public affairs consultant. He lives with his mother. He enjoys puzzles, lego building, and watching TV.   No Known Allergies  Physical Exam BP 130/78   Pulse 88   Ht 5\' 10"  (1.778 m)   Wt 283 lb 12.8 oz (128.7 kg)   BMI 40.72  kg/m   General: alert, well developed, obese, in no acute distress, brown hair, brown eyes, right handed Head: normocephalic, no dysmorphic features Ears, Nose and Throat: Otoscopic: tympanic membranes normal; pharynx: oropharynx is pink without exudates or tonsillar hypertrophy Neck: supple, full range of motion, no cranial or cervical bruits Respiratory: auscultation clear Cardiovascular: no murmurs, pulses are normal Musculoskeletal: no skeletal deformities or apparent scoliosis Skin: no rashes or neurocutaneous lesions  Neurologic Exam  Mental Status: alert; oriented to person, place and year; knowledge is normal for age; language is normal Cranial Nerves: visual fields are full to double simultaneous stimuli; extraocular movements are full and conjugate; pupils are round reactive to light; funduscopic examination shows sharp disc margins with normal vessels; symmetric facial strength; midline tongue and uvula; air conduction is greater than bone conduction bilaterally Motor: Normal strength, tone and mass; good fine motor movements; no pronator drift Sensory: intact responses to cold, vibration, proprioception and stereognosis Coordination: good finger-to-nose, rapid repetitive alternating movements and finger apposition Gait and Station: normal gait and station:  patient is able to walk on heels, toes and tandem without difficulty; balance is adequate; Romberg exam is negative; Gower response is negative Reflexes: symmetric and diminished bilaterally; no clonus; bilateral flexor plantar responses  Assessment 1.  Epilepsy, generalized, convulsive, G40.309. 2.  Partial epilepsy with impairment of consciousness, G40.209. 3.  Mild intellectual disability, F 70. 4.  Obesity, E66.9.  Discussion At present I am not going to change levetiracetam.  He is at a very high dose of the medication.  Ordinarily I do not prescribe more than 3000 mg in a day.  Because he is a large man, we might go  higher but it is unlikely that it will significantly improve his seizure control.  Plan A prescription was issued for levetiracetam.  He will return to see me in 6 months time I will see him sooner based on clinical need.  Greater than 50% of a 25-minute visit was spent in counseling and coordination of care concerning his seizures his job, and his obesity.  I counseled that he increase his physical activity and work hard to decrease his oral intake of empty calories.   Medication List   Accurate as of August 10, 2019  8:23 AM. If you have any questions, ask your nurse or doctor.    amLODipine 5 MG tablet Commonly known as: NORVASC Take 5 mg by mouth every morning.   HM MULTIVITAMIN ADULT GUMMY PO Take 1 each by mouth every morning.   Levetiracetam 750 MG Tb24 Take 2 tablets twice daily    The medication list was reviewed and reconciled. All changes or newly prescribed medications were explained.  A complete medication list was provided to the patient/caregiver.  Deetta Perla MD

## 2019-11-19 ENCOUNTER — Ambulatory Visit: Payer: 59 | Attending: Internal Medicine

## 2019-11-19 DIAGNOSIS — Z23 Encounter for immunization: Secondary | ICD-10-CM

## 2019-11-19 NOTE — Progress Notes (Signed)
   Covid-19 Vaccination Clinic  Name:  Isaiah Knapp    MRN: [id number] DOB: 1995/11/28  11/19/2019  [name] was observed post Covid-19 immunization for 15 minutes without incident. He was provided with Vaccine Information Sheet and instruction to access the V-Safe system.   [name] was instructed to call 911 with any severe reactions post vaccine: Marland Kitchen Difficulty breathing  . Swelling of face and throat  . A fast heartbeat  . A bad rash all over body  . Dizziness and weakness   Immunizations Administered    Name Date Dose VIS Date Route   Pfizer COVID-19 Vaccine 11/19/2019  9:03 AM 0.3 mL 09/22/2018 Intramuscular   Manufacturer: [employer], [employer]   Lot: [id number]   NDC: [address]-5

## 2019-12-13 ENCOUNTER — Ambulatory Visit: Payer: 59 | Attending: Internal Medicine

## 2019-12-13 DIAGNOSIS — Z23 Encounter for immunization: Secondary | ICD-10-CM

## 2019-12-13 NOTE — Progress Notes (Signed)
   Covid-19 Vaccination Clinic  Name:  Isaiah Knapp    MRN: [id number] DOB: [birth year]  12/13/2019  [name] was observed post Covid-19 immunization for 15 minutes without incident. He was provided with Vaccine Information Sheet and instruction to access the V-Safe system.   [name] was instructed to call 911 with any severe reactions post vaccine: Marland Kitchen Difficulty breathing  . Swelling of face and throat  . A fast heartbeat  . A bad rash all over body  . Dizziness and weakness   Immunizations Administered    Name Date Dose VIS Date Route   Pfizer COVID-19 Vaccine 12/13/2019  8:58 AM 0.3 mL 09/22/2018 Intramuscular   Manufacturer: [employer], [employer]   Lot: [id number]   NDC: [address]-3

## 2019-12-15 ENCOUNTER — Encounter (INDEPENDENT_AMBULATORY_CARE_PROVIDER_SITE_OTHER): Payer: Self-pay

## 2019-12-20 ENCOUNTER — Telehealth (INDEPENDENT_AMBULATORY_CARE_PROVIDER_SITE_OTHER): Payer: 59 | Admitting: Pediatrics

## 2019-12-20 ENCOUNTER — Encounter (INDEPENDENT_AMBULATORY_CARE_PROVIDER_SITE_OTHER): Payer: Self-pay | Admitting: Pediatrics

## 2019-12-20 DIAGNOSIS — F70 Mild intellectual disabilities: Secondary | ICD-10-CM

## 2019-12-20 DIAGNOSIS — G40209 Localization-related (focal) (partial) symptomatic epilepsy and epileptic syndromes with complex partial seizures, not intractable, without status epilepticus: Secondary | ICD-10-CM

## 2019-12-20 DIAGNOSIS — G40309 Generalized idiopathic epilepsy and epileptic syndromes, not intractable, without status epilepticus: Secondary | ICD-10-CM | POA: Diagnosis not present

## 2019-12-20 DIAGNOSIS — Z79899 Other long term (current) drug therapy: Secondary | ICD-10-CM

## 2019-12-20 DIAGNOSIS — Z6841 Body Mass Index (BMI) 40.0 and over, adult: Secondary | ICD-10-CM

## 2019-12-20 MED ORDER — OXCARBAZEPINE 600 MG PO TABS: ORAL_TABLET | ORAL | 5 refills | Status: DC

## 2019-12-20 NOTE — Patient Instructions (Signed)
It was a pleasure to see you today.  I want to see if we can bring his seizures under control.  Looking back of the chart I see that he was on Tegretol and Carbatrol.  Oxcarbazepine is in the family of those medicines but not the same and is usually more effective.  If that fails, there is another medicine called Aptiom that I would be interested in trying.  We are going to gradually increase the oxcarbazepine and then have a blood test about a week after you get to your highest dose.  You will use 600 mg tablets and give 300 twice daily for days, 600 twice daily for 4 days then 900 twice daily.  After week we will then check a sodium, ALT which is a liver function, CBC with differential, and a morning trough oxcarbazepine.  I talked with you after his visit and we are sending the order to [facility].  This will probably delay things for about a week.  Continue to take levetiracetam without change.  I am pleased that everyone is immunized for Covid.  This will definitely decrease your chances of getting sick.  I would like to see Makai in my office in 3 months.

## 2019-12-20 NOTE — Telephone Encounter (Signed)
I called mom to determine whether or not the prescription should be sent to [facility] or the local pharmacy.

## 2019-12-20 NOTE — Progress Notes (Signed)
This is a Pediatric Specialist E-Visit follow up consult provided via caregility Isaiah Knapp and their parent/guardian Isaiah Knapp consented to an E-Visit consult today.  Location of patient: Isaiah Knapp is at home Location of provider: Jack Knapp is in office Patient was referred by Isaiah Soho, PA-C   The following participants were involved in this E-Visit: patient, mother, CMA, provider  Chief Complain/ Reason for E-Visit today: Epilepsy Total time on call: 25 minutes Follow up: 3 months    Patient: Isaiah Knapp MRN: [id number] Sex: male DOB: 1996/06/18  Provider: Ellison Carwin, MD Location of Care: [facility]  Note type: Routine return visit  History of Present Illness: Referral Source: Isaiah Rud, MD History from: mother, patient and [facility] chart Chief Complaint: Epilepsy  Isaiah Knapp is a 24 y.o. male who was evaluated virtually on Dec 20, 2019 for the first time since August 10, 2019.  If he has focal epilepsy with impairment of consciousness and occasional secondary generalization.  He suffered a closed head injury at 24 years of age which may be responsible for his seizures.  His most recent EEG March 22, 2019 showed right frontotemporal diphasic sharply contoured slow wave activity in the background that was otherwise normal.  This became more prominent in drowsiness and natural sleep.  He has averaged about 1 seizure a month.  I had no contact from the family since January.  See the prior note for the frequency of his seizures prior to that.  We discussed that he is at the maximum dose of levetiracetam (1500 mg twice daily).  I want him to talk to his mother about next steps.  I think that either oxcarbazepine, or Aptiom are appropriate steps.  He has been on Tegretol and Carbatrol before.  I do not see a reason to restart those medications.  His most recent event was associated with going to the bathroom because he was dizzy.  He went  to sit down and fell to the floor.  He had unresponsive staring and some low amplitude shaking.  Not clear that he truly was jerking.  This lasted for under a couple of minutes.  It took him several minutes to begin to respond.  His general health is good.  He is sleeping well, going to bed around 8 PM and getting up around 5:50 AM.  He works out about 50 minutes 5 times a week.  He walks on a treadmill.  His maternal aunt who lives in [place] contracted Covid but no one in his home has.  All members of the home are vaccinated, including Isaiah Knapp.  Review of Systems: A complete review of systems was remarkable for patient is here to be seen for epilepsy. Mother states that the patient ishaving one seizure a month. patient states that he has not missed any medication doses. Mom states that maybe the medication needs to be adjusted. No other concerns at this time,, all other systems reviewed and negative.  Past Medical History Diagnosis Date  . Hypertension   . Seizure (HCC) 2001   from head trauma   Hospitalizations: No., Head Injury: No., Nervous System Infections: No., Immunizations up to date: Yes.    Copied from previous record: Patient was in NICU for 3 months after birth due to being born at 48 weeks.   He suffered a head injury at the age of 4 jumping from a bed falling off and hitting his head on the bed frame, he was treated at [facility] in [place]. Patient suffered a head injury at the age of 24 years old from a fall and stayed for 3 to 4 days at [facility] in [place].   Records from [facility] in [place], [place] were reviewed. They mention that for the most part seizure control was good. He was prescribed both Tegretol and Carbatrol. He was noted to have developmental delay. Mention was made that he was followed by neurology. He had mild hyperreflexia in his lower extremities including ankle clonus he had tight Achilles tendons.   EEG performed February 10, 2003 showed frequent independent and synchronous frontal polar spike and spike and wave discharges at times occurring on multiple occasions throughout each 10 second epoch Predominance shifted between hemispheres. At times they spread over the paracentral regions posteriorly right greater than left. Background activity appeared otherwise normal. There were no photoparoxysmal response was. Seizure activity was more prominent when the patient was asleep than awake.   EEG performed July 31, 2011 with a normal record with the patient awake. He tapered off Carbatrol in November 2014 afterKepprawas added to his regimen.  EEG September 01, 2017 was normal record awake, drowsy, and asleep. A normal EEG does not rule out the presence of seizures.  CT brain March 09, 2019 showed normal brain with a calcification along the right tentorial falx 1.3 x 0.8 cm.  EEG March 22, 2019 showed right frontotemporal diphasic sharply contoured slow wave activity. Background activity was otherwise normal with the patient awake, drowsy, and asleep  Behavior History none  Surgical History History reviewed. No pertinent surgical history.  Family History family history includes Breast cancer in his paternal grandmother; Heart attack in his maternal grandfather and paternal grandfather. Family history is negative for migraines, seizures, intellectual disabilities, blindness, deafness, birth defects, chromosomal disorder, or autism.  Social History Socioeconomic History  . Marital status: Single  . Years of education:  63  . Highest education level:  High school graduate  Occupational History  .  Works in [employer] in [employer] when school is in session  Tobacco Use  . Smoking status: Never Smoker  . Smokeless tobacco: Never Used  Substance and Sexual Activity  . Alcohol use: No  . Drug use: No  . Sexual activity: Never  Social History Narrative    Isaiah Knapp graduated from [employer] (internship); he does well in both. 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 He lives with his mother. He enjoys puzzles, lego building, and watching TV.   No Known Allergies  Physical Exam There were no vitals taken for this visit.  General: alert, well developed, obese, in no acute distress, brown hair, brown eyes, right handed Head: normocephalic, no dysmorphic features Neck: supple, full range of motionnormal Musculoskeletal: no skeletal deformities or apparent scoliosis Skin: no rashes or neurocutaneous lesions  Neurologic Exam  Mental Status: alert; oriented to person, place and year; knowledge is normal for age; language is normal Cranial Nerves: visual fields are full to double simultaneous stimuli; extraocular movements are full and conjugate;  symmetric facial strength; midline tongue; hearing appears normal bilaterally Motor: normal functional strength, tone and mass; good fine motor movements; no pronator drift Coordination: good finger-to-nose, rapid repetitive alternating movements and finger apposition Gait and Station: normal gait and station: patient is able to walk on heels, toes and tandem without difficulty; balance is adequate; Romberg exam is negative; Gower response is negative  Assessment 1.  Focal epilepsy with impairment  of consciousness, G40.209. 2.  Epilepsy, generalized, convulsive, G40.309. 3.  Obesity, E66.9. 4.  Mild intellectual disability, F70.  Discussion It is time to add another medication.    Plan I have recommended oxcarbazepine.  We will gradually introduce it by starting it at 300 mg twice daily and at 4-day intervals increasing by 300 mg per dose to a total of 900 mg twice daily.  After 2 weeks we will check ALT, sodium, CBC with differential, and morning trough oxcarbazepine.  There will be no change in levetiracetam for now.  If we are able to bring his seizures under control, then we will slowly  taper and discontinue levetiracetam.  He will return in 3 months for routine visit.  Greater than 50% of a 25-minute visit was spent in counseling of coordination of care concerning his seizures and discussing changes to bring about better control.  I also discussed the logistics of introducing a new medication.   Medication List   Accurate as of Dec 20, 2019  2:39 PM. If you have any questions, ask your nurse or doctor.    amLODipine 5 MG tablet Commonly known as: NORVASC Take 5 mg by mouth every morning.   HM MULTIVITAMIN ADULT GUMMY PO Take 1 each by mouth every morning.   Levetiracetam 750 MG Tb24 Take 2 tablets twice daily   oxcarbazepine 600 MG tablet Commonly known as: TRILEPTAL Take 1/2 tablet twice daily for 4 days, then 1 tablet twice daily for 4 days, then 1-1/2 tablets twice daily Started by: Isaiah Carwin, MD    The medication list was reviewed and reconciled. All changes or newly prescribed medications were explained.  A complete medication list was provided to the patient/caregiver.  Deetta Perla MD

## 2020-01-12 ENCOUNTER — Telehealth (INDEPENDENT_AMBULATORY_CARE_PROVIDER_SITE_OTHER): Payer: Self-pay | Admitting: Pediatrics

## 2020-01-12 ENCOUNTER — Encounter (INDEPENDENT_AMBULATORY_CARE_PROVIDER_SITE_OTHER): Payer: Self-pay | Admitting: Pediatrics

## 2020-01-12 NOTE — Telephone Encounter (Signed)
Left message saying to schedule a follow up with Dr. Sharene Skeans in August. A letter is also sent.

## 2020-01-19 ENCOUNTER — Encounter (INDEPENDENT_AMBULATORY_CARE_PROVIDER_SITE_OTHER): Payer: Self-pay

## 2020-01-21 ENCOUNTER — Other Ambulatory Visit: Payer: Self-pay | Admitting: Pediatrics

## 2020-01-24 LAB — ALT: ALT: 23 U/L (ref 9–46)

## 2020-01-24 LAB — CBC WITH DIFFERENTIAL/PLATELET
Absolute Monocytes: 697 cells/uL (ref 200–950)
Basophils Absolute: 59 cells/uL (ref 0–200)
Basophils Relative: 0.7 %
Eosinophils Absolute: 168 cells/uL (ref 15–500)
Eosinophils Relative: 2 %
HCT: 47.9 % (ref 38.5–50.0)
Hemoglobin: 16.1 g/dL (ref 13.2–17.1)
Lymphs Abs: 2041 cells/uL (ref 850–3900)
MCH: 26.1 pg — ABNORMAL LOW (ref 27.0–33.0)
MCHC: 33.6 g/dL (ref 32.0–36.0)
MCV: 77.8 fL — ABNORMAL LOW (ref 80.0–100.0)
MPV: 9.7 fL (ref 7.5–12.5)
Monocytes Relative: 8.3 %
Neutro Abs: 5435 cells/uL (ref 1500–7800)
Neutrophils Relative %: 64.7 %
Platelets: 337 10*3/uL (ref 140–400)
RBC: 6.16 10*6/uL — ABNORMAL HIGH (ref 4.20–5.80)
RDW: 14.7 % (ref 11.0–15.0)
Total Lymphocyte: 24.3 %
WBC: 8.4 10*3/uL (ref 3.8–10.8)

## 2020-01-24 LAB — 10-HYDROXYCARBAZEPINE: Triliptal/MTB(Oxcarbazepin): 17.6 ug/mL (ref 8.0–35.0)

## 2020-01-24 LAB — SODIUM: Sodium: 138 mmol/L (ref 135–146)

## 2020-01-25 ENCOUNTER — Encounter (INDEPENDENT_AMBULATORY_CARE_PROVIDER_SITE_OTHER): Payer: Self-pay | Admitting: Pediatrics

## 2020-01-25 ENCOUNTER — Other Ambulatory Visit: Payer: Self-pay

## 2020-01-25 ENCOUNTER — Ambulatory Visit (INDEPENDENT_AMBULATORY_CARE_PROVIDER_SITE_OTHER): Payer: 59 | Admitting: Pediatrics

## 2020-01-25 ENCOUNTER — Telehealth (INDEPENDENT_AMBULATORY_CARE_PROVIDER_SITE_OTHER): Payer: Self-pay | Admitting: Pediatrics

## 2020-01-25 VITALS — BP 124/72 | HR 84 | Ht 69.5 in | Wt 282.4 lb

## 2020-01-25 DIAGNOSIS — F70 Mild intellectual disabilities: Secondary | ICD-10-CM

## 2020-01-25 DIAGNOSIS — G40209 Localization-related (focal) (partial) symptomatic epilepsy and epileptic syndromes with complex partial seizures, not intractable, without status epilepticus: Secondary | ICD-10-CM | POA: Diagnosis not present

## 2020-01-25 DIAGNOSIS — G40309 Generalized idiopathic epilepsy and epileptic syndromes, not intractable, without status epilepticus: Secondary | ICD-10-CM

## 2020-01-25 DIAGNOSIS — Z6841 Body Mass Index (BMI) 40.0 and over, adult: Secondary | ICD-10-CM

## 2020-01-25 NOTE — Telephone Encounter (Signed)
Have an appointment with him later today.  He had a single seizure that happened when he didn't take medication as ordered.  Otherwise he is tolerating the medicine and feeling well.

## 2020-01-25 NOTE — Patient Instructions (Signed)
I am pleased that you are doing well.  I think that we should continue both medications until you have been seizure-free for at least 3 months.

## 2020-01-25 NOTE — Telephone Encounter (Signed)
L/M informing mom that it was the appointment system calling to remind them about the appointment. Informed her that if they told her to call back, it was just to verify that they are coming to the appointment. Invited her to call back if need be

## 2020-01-25 NOTE — Telephone Encounter (Signed)
Mom called back again stating that it was Dr. Sharene Skeans who had called her, not an automated system or anyone else in the office

## 2020-01-25 NOTE — Telephone Encounter (Signed)
Who's calling (name and relationship to patient) : Kolden Dupee mom  Best contact number: [phone]  Provider they see: Dr. Sharene Skeans  Reason for call: Mother called stating she was till going to attend appointment today. She received a call instructing her to call the office and didn't know what she needed to call back for  Call ID:      PRESCRIPTION REFILL ONLY  Name of prescription:  Pharmacy:

## 2020-01-25 NOTE — Progress Notes (Signed)
Patient: Isaiah Knapp MRN: [id number] Sex: male DOB: 04-16-1996  Provider: Ellison Carwin, MD Location of Care: [facility]  Note type: Routine return visit  History of Present Illness: Referral Source: Kandyce Rud, MD History from: mother, patient and [facility] chart Chief Complaint: Epilepsy  Isaiah Knapp is a 24 y.o. male who returns January 25, 2020 for the 1st time since Dec 20, 2019.  He has focal epilepsy with impairment of consciousness and occasional secondary generalization.  He suffered a closed head injury at 24 years of age which may be responsible for injury to the brain causing his seizures.  His most recent EEG March 22, 2019 showed right frontotemporal diphasic sharply contoured slow wave activity in the background that was otherwise normal.  This became more prominent in drowsiness and natural sleep.  On his last visit he was averaging about 1 seizure a month.  He is on a maximum dose of levetiracetam.  On that visit an event that was associated with dizziness that forced him to try to sit on the floor causing him to fall.  He had unresponsive staring and some low amplitude shaking that lasted for a couple of minutes.  He had a postictal confusion for several minutes.  I recommended adding oxcarbazepine to his levetiracetam and escalating it over a period of 2 weeks.  Thus far this is worked quite well.  He had 1 breakthrough seizure that happened when he missed a pill.  Laboratory studies performed January 21, 2020 showed a 10 hydroxycarbazepine level of 17.6 mcg/mL which is solidly in the therapeutic range.  Simultaneous studies of his CBC were normal other than an MCV of 77.8.  Sodium was normal at 138.  He feels well.  He has now been a month since we started the medication.  It is too soon to know whether or not this is going to be effective.  I want him to continue the tandem of levetiracetam and oxcarbazepine until he has been seizure-free for 6 months  before I would consider trying to taper levetiracetam to simplify his regimen.  We do not know at this time whether either medication is providing his seizures or whether it takes both together.  Isaiah Knapp has been healthy.  He has not contracted Covid.  He is vaccinated for the virus as have all the family members living in his home.  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  The school is going to reopen fully and he has to report in early August.  He has good sleep hygiene going to bed somewhere around 8 PM, sometimes later and sleeping until near 6 AM.  Review of Systems: A complete review of systems was remarkable for patient is here to be seen for seizures. Mom reports that patient had one seizure. She states that it was due to a miscommunication with medication directions. She states that once they got the dose right, he has not hadone since. She has no other concerns at this time., all other systems reviewed and negative.  Past Medical History Diagnosis Date  . Hypertension   . Seizure (HCC) 2001   from head trauma   Hospitalizations: No., Head Injury: No., Nervous System Infections: No., Immunizations up to date: Yes.    Copied from previous record note Patient was in NICU for 3 months after birth due to being born at 47 weeks.   He suffered a head injury at the age of  4 jumping from a bed falling off and hitting his head on the bed frame, he was treated at [facility] in [place]. Patient suffered a head injury at the age of 24 years old from a fall and stayed for 3 to 4 days at [facility] in [place].   Records from [facility] in [place], [place] were reviewed. They mention that for the most part seizure control was good. He was prescribed both Tegretol and Carbatrol. He was noted to have developmental delay. Mention was made that he was followed by neurology. He had mild hyperreflexia in his lower extremities including  ankle clonus he had tight Achilles tendons.   EEG performed February 10, 2003 showed frequent independent and synchronous frontal polar spike and spike and wave discharges at times occurring on multiple occasions throughout each 10 second epoch Predominance shifted between hemispheres. At times they spread over the paracentral regions posteriorly right greater than left. Background activity appeared otherwise normal. There were no photoparoxysmal response was. Seizure activity was more prominent when the patient was asleep than awake.   EEG performed July 31, 2011 with a normal record with the patient awake. He tapered off Carbatrol in November 2014 afterKepprawas added to his regimen.  EEG September 01, 2017 was normal record awake, drowsy, and asleep. A normal EEG does not rule out the presence of seizures.  CT brain March 09, 2019 showed normal brain with a calcification along the right tentorial falx 1.3 x 0.8 cm.  EEG March 22, 2019 showed right frontotemporal diphasic sharply contoured slow wave activity. Background activity was otherwise normal with the patient awake, drowsy, and asleep  Behavior History none  Surgical History History reviewed. No pertinent surgical history.  Family History family history includes Breast cancer in his paternal grandmother; Heart attack in his maternal grandfather and paternal grandfather. Family history is negative for migraines, seizures, intellectual disabilities, blindness, deafness, birth defects, chromosomal disorder, or autism.  Social History Socioeconomic History  . Marital status: Single  . Years of education: 28  . Highest education level:  High school graduate  Occupational History  .  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 Smoking status: Never Smoker  . Smokeless tobacco: Never Used  Substance and Sexual Activity  . Alcohol use: No  .  Drug use: No  . Sexual activity: Never  Social History Narrative    Avian graduated from [employer] (internship); he does well in both. He is off work for the summer but will return in the fall. He lives with his mother. He enjoys puzzles, lego building, and watching TV.   No Known Allergies  Physical Exam BP 124/72   Pulse 84   Ht 5' 9.5" (1.765 m)   Wt 282 lb 6.4 oz (128.1 kg)   BMI 41.11 kg/m   General: alert, well developed, obese, in no acute distress, brown hair, brown eyes, right handed Head: normocephalic, no dysmorphic features Ears, Nose and Throat: Otoscopic: tympanic membranes occluded with wax; pharynx: oropharynx is pink without exudates or tonsillar hypertrophy Neck: supple, full range of motion, no cranial or cervical bruits Respiratory: auscultation clear Cardiovascular: no murmurs, pulses are normal Musculoskeletal: no skeletal deformities or apparent scoliosis Skin: no rashes or neurocutaneous lesions  Neurologic Exam  Mental Status: alert; oriented to person, place and year; knowledge is below normal for age; language is normal for conversation and communication  Cranial Nerves: visual fields are full to double simultaneous stimuli; extraocular movements are full and conjugate; pupils are round reactive to light; funduscopic examination shows sharp disc margins with normal vessels; symmetric facial strength; midline tongue and uvula; air conduction is greater than bone conduction bilaterally Motor: Normal strength, tone and mass; good fine motor movements; no pronator drift Sensory: intact responses to cold, vibration, proprioception and stereognosis Coordination: good finger-to-nose, rapid repetitive alternating movements and finger apposition Gait and Station: normal gait and station: patient is able to walk on heels, toes and tandem without difficulty; balance is adequate; Romberg exam is negative Reflexes: symmetric and diminished  bilaterally; no clonus; bilateral flexor plantar responses  Assessment 1.  Focal epilepsy with impairment of consciousness, G40.209. 2.  Generalized convulsive epilepsy, G 40.309. 3.  Mild intellectual disability, F70. 4.  Obesity, E66.9.  Discussion Beldon is medically and neurologically stable.  His weight is virtually identical from August 10, 2019 only a pound and a half different.  This suggest to me that he is working very hard to maintain his weight by diet and physical activity.  Plan Levetiracetam and oxcarbazepine will be continued at their current dose.  His mother will contact me if he has any breakthrough seizures.  I told him is very important to evaluate with adjust his oxcarbazepine upwards.  I do not think he needs to have any other laboratory testing for about 3 months.  Greater than 50% of a 15-minute visit was devoted to counseling and coordination of care concerning his seizures, his job, coronavirus.  He will return to see me in 6 months.  I will see him sooner based on clinical need.   Medication List   Accurate as of January 25, 2020  2:06 PM. If you have any questions, ask your nurse or doctor.    amLODipine 5 MG tablet Commonly known as: NORVASC Take 5 mg by mouth every morning.   HM MULTIVITAMIN ADULT GUMMY PO Take 1 each by mouth every morning.   Levetiracetam 750 MG Tb24 Take 2 tablets twice daily   oxcarbazepine 600 MG tablet Commonly known as: TRILEPTAL Take 1-1/2 tablets twice daily    The medication list was reviewed and reconciled. All changes or newly prescribed medications were explained.  A complete medication list was provided to the patient/caregiver.  Deetta Perla MD

## 2020-02-28 ENCOUNTER — Other Ambulatory Visit (INDEPENDENT_AMBULATORY_CARE_PROVIDER_SITE_OTHER): Payer: Self-pay | Admitting: Pediatrics

## 2020-02-28 DIAGNOSIS — G40309 Generalized idiopathic epilepsy and epileptic syndromes, not intractable, without status epilepticus: Secondary | ICD-10-CM

## 2020-05-15 ENCOUNTER — Other Ambulatory Visit (INDEPENDENT_AMBULATORY_CARE_PROVIDER_SITE_OTHER): Payer: Self-pay | Admitting: Pediatrics

## 2020-05-15 DIAGNOSIS — G40309 Generalized idiopathic epilepsy and epileptic syndromes, not intractable, without status epilepticus: Secondary | ICD-10-CM

## 2020-05-15 DIAGNOSIS — G40209 Localization-related (focal) (partial) symptomatic epilepsy and epileptic syndromes with complex partial seizures, not intractable, without status epilepticus: Secondary | ICD-10-CM

## 2020-07-27 ENCOUNTER — Other Ambulatory Visit: Payer: Self-pay

## 2020-07-27 ENCOUNTER — Ambulatory Visit (INDEPENDENT_AMBULATORY_CARE_PROVIDER_SITE_OTHER): Payer: 59 | Admitting: Pediatrics

## 2020-07-27 ENCOUNTER — Encounter (INDEPENDENT_AMBULATORY_CARE_PROVIDER_SITE_OTHER): Payer: Self-pay | Admitting: Pediatrics

## 2020-07-27 VITALS — BP 130/88 | HR 76 | Ht 69.5 in | Wt 272.6 lb

## 2020-07-27 DIAGNOSIS — F70 Mild intellectual disabilities: Secondary | ICD-10-CM

## 2020-07-27 DIAGNOSIS — G40209 Localization-related (focal) (partial) symptomatic epilepsy and epileptic syndromes with complex partial seizures, not intractable, without status epilepticus: Secondary | ICD-10-CM

## 2020-07-27 DIAGNOSIS — E6609 Other obesity due to excess calories: Secondary | ICD-10-CM | POA: Diagnosis not present

## 2020-07-27 DIAGNOSIS — G40309 Generalized idiopathic epilepsy and epileptic syndromes, not intractable, without status epilepticus: Secondary | ICD-10-CM | POA: Diagnosis not present

## 2020-07-27 DIAGNOSIS — Z6839 Body mass index (BMI) 39.0-39.9, adult: Secondary | ICD-10-CM

## 2020-07-27 MED ORDER — OXCARBAZEPINE 600 MG PO TABS: ORAL_TABLET | ORAL | 3 refills | Status: DC

## 2020-07-27 NOTE — Progress Notes (Signed)
Patient: Orrin Yurkovich MRN: [id number] Sex: male DOB: 05/28/1996  Provider: Ellison Carwin, MD Location of Care: [facility]  Note type: Routine return visit  History of Present Illness: Referral Source: Kandyce Rud, MD History from: mother, patient and [facility] chart Chief Complaint: Epilepsy  Parag Dorton is a 24 y.o. male who was evaluated July 27, 2020 for the first time since January 25, 2020.  Rachael has focal epilepsy with impairment of consciousness and occasional secondary generalization.  He suffered closed head injury at 24 years of age which may be responsible for the seizures that he has.  His laboratory work-up he is in the past medical history.  We push levetiracetam as far as we could but on maximum dose of the levetiracetam he was averaging 1 seizure per month.  Once oxcarbazepine was started and escalated, he had 1 breakthrough seizure that happened when he missed a pill.  He has been seizure-free for over 6 months.  At this time I would like to taper or discontinue levetiracetam.  There is a small chance that he will have breakthrough seizures but I think it is unlikely.  He is oxcarbazepine level is in the solid therapeutic range.  He feels well.  He has lost 10 pounds since I saw him he spends about 45 minutes on a treadmill 5 days a week and is watching his weight which is good.  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  He goes to bed around 8 PM and gets up around 6 AM.  He has had no serious illnesses.  He is vaccinated and has had a booster for Covid.  When he is not working he stays at home with his family.  They go out only to shop for groceries.  We discussed my retirement in September 2022.  I will find and speak with an adult neurologist concerning transition of care prior to that time.  Review of Systems: A complete review of systems was remarkable for patient is here to be seen for  seizurs. Mom reports that the patient has been doing wel since his last visit. She states that he has not had any seizures. She reports no concerns at this time., all other systems reviewed and negative.  Past Medical History Diagnosis Date   Hypertension    Seizure (HCC) 2001   from head trauma   Hospitalizations: No., Head Injury: No., Nervous System Infections: No., Immunizations up to date: Yes.    Copied from previous record notes Patient was in NICU for 3 months after birth due to being born at 62 weeks.   He suffered a head injury at the age of 4 jumping from a bed falling off and hitting his head on the bed frame, he was treated at [facility] in [place]. Patient suffered a head injury at the age of 24 years old from a fall and stayed for 3 to 4 days at [facility] in [place].   Records from [facility] in [place], [place] were reviewed. They mention that for the most part seizure control was good. He was prescribed both Tegretol and Carbatrol. He was noted to have developmental delay. Mention was made that he was followed by neurology. He had mild hyperreflexia in his lower extremities including ankle clonus he had tight Achilles tendons.   EEG performed February 10, 2003 showed frequent independent and synchronous frontal polar spike and spike and wave discharges  at times occurring on multiple occasions throughout each 10 second epoch Predominance shifted between hemispheres. At times they spread over the paracentral regions posteriorly right greater than left. Background activity appeared otherwise normal. There were no photoparoxysmal response was. Seizure activity was more prominent when the patient was asleep than awake.   EEG performed July 31, 2011 with a normal record with the patient awake. He tapered off Carbatrol in November 2014 afterKepprawas added to his regimen.  EEG September 01, 2017 was normal record awake, drowsy, and asleep. A normal  EEG does not rule out the presence of seizures.  CT brain March 09, 2019 showed normal brain with a calcification along the right tentorial falx 1.3 x 0.8 cm.  EEG March 22, 2019 showed right frontotemporal diphasic sharply contoured slow wave activity. Background activity was otherwise normal with the patient awake, drowsy, and asleep  Laboratory studies performed January 21, 2020 showed a 10 hydroxycarbazepine level of 17.6 mcg/mL which is solidly in the therapeutic range.  Simultaneous studies of his CBC were normal other than an MCV of 77.8.  Sodium was normal at 138.  Behavior History none  Surgical History History reviewed. No pertinent surgical history.  Family History family history includes Breast cancer in his paternal grandmother; Heart attack in his maternal grandfather and paternal grandfather. Family history is negative for migraines, seizures, intellectual disabilities, blindness, deafness, birth defects, chromosomal disorder, or autism.  Social History Socioeconomic History   Marital status: Single   Years of education:  13   Highest education level:  High school graduate  Occupational History    Dishwasher at [employer]  Tobacco Use   Smoking status: Never Smoker   Smokeless tobacco: Never Used  Substance and Sexual Activity   Alcohol use: No   Drug use: No   Sexual activity: Never  Social History Narrative   Cedric graduated from [employer] (internship); he does well in both. He is off work for the summer but will return this winter. He lives with his mother. He enjoys puzzles, lego building, and watching TV.   No Known Allergies  Physical Exam BP 130/88    Pulse 76    Ht 5' 9.5" (1.765 m)    Wt 272 lb 9.6 oz (123.7 kg)    BMI 39.68 kg/m   General: alert, well developed, obese, in no acute distress, brown hair, brown eyes, right handed Head: normocephalic, no dysmorphic features Ears, Nose and Throat: Otoscopic:  tympanic membranes normal; pharynx: oropharynx is pink without exudates or tonsillar hypertrophy Neck: supple, full range of motion, no cranial or cervical bruits Respiratory: auscultation clear Cardiovascular: no murmurs, pulses are normal Musculoskeletal: no skeletal deformities or apparent scoliosis Skin: no rashes or neurocutaneous lesions  Neurologic Exam  Mental Status: alert; oriented to person, place and year; knowledge is normal for age; language is normal Cranial Nerves: visual fields are full to double simultaneous stimuli; extraocular movements are full and conjugate; pupils are round reactive to light; funduscopic examination shows sharp disc margins with normal vessels; symmetric facial strength; midline tongue and uvula; air conduction is greater than bone conduction bilaterally Motor: Normal strength, tone and mass; good fine motor movements; no pronator drift Sensory: intact responses to cold, vibration, proprioception and stereognosis Coordination: good finger-to-nose, rapid repetitive alternating movements and finger apposition Gait and Station: slightly broad-based and waddling but stable gait and station: patient is able to walk on heels, toes and tandem without difficulty; balance is adequate; Romberg exam is negative;  Gower response is negative Reflexes: symmetric and diminished bilaterally; no clonus; bilateral flexor plantar responses  Assessment 1.  Focal epilepsy with impairment of consciousness, G40.209. 2.  Generalized convulsive epilepsy, G 40.309. 3.  Mild intellectual disability, F70. 4.  Obesity, E66.9.  Discussion I am very pleased that Flay is doing well with his seizures.  It is time to try to simplify his regimen by tapering and discontinuing levetiracetam.  I am very pleased that he is lost 10 pounds.  I told him to keep working out and watch his oral intake.  Plan A 1 year prescription for oxcarbazepine was made.  Given that we have labs within the  last 6 months I did not recommend repeating them we may do so when I see him again in 6 months.  Greater than 50% of a 30-minute visit was spent in counseling and coordination of care concerning his seizures, his weight, and transition of his care when I retire.  His mother was advised to contact me if he has breakthrough seizures which could happen while we are tapering the medicine for the first 6 months afterwards.  I likely would increase oxcarbazepine before restarting levetiracetam.   Medication List   Accurate as of July 27, 2020  9:25 AM. If you have any questions, ask your nurse or doctor.    amLODipine 5 MG tablet Commonly known as: NORVASC Take 5 mg by mouth every morning.   HM MULTIVITAMIN ADULT GUMMY PO Take 1 each by mouth every morning.   Levetiracetam 750 MG Tb24 We will taper by 1 tablet every other week going from 2 tablets twice a day to discontinuing over 6 weeks   oxcarbazepine 600 MG tablet Commonly known as: TRILEPTAL TAKE 1 AND 1/2 TABLETS BY  MOUTH TWICE DAILY    The medication list was reviewed and reconciled. All changes or newly prescribed medications were explained.  A complete medication list was provided to the patient/caregiver.  Deetta Perla MD

## 2020-07-27 NOTE — Patient Instructions (Signed)
I am pleased that you are doing well, that your seizures are under good control, and that you have lost 10 pounds.  I would like to see you again in 6 months.  At that time I hope to be able to give you the name and contact information for the adult neurologist who will provide care after I retire in September 2022.  I want you to slowly taper your levetiracetam.  Currently you are taking 2 tablets twice daily.  I want you to drop by 1 tablet every 2 weeks.  You will drop to 1 in the morning and 2 at nighttime for 2 weeks 1 twice daily for 2 weeks, 1 at nighttime for 2 weeks and then discontinue.  That 84 tablets.  Please let me know if there are any breakthrough seizures.  Also let me know if there are any other concerns that you have that I can help with.

## 2020-08-15 ENCOUNTER — Encounter (HOSPITAL_COMMUNITY): Payer: Self-pay | Admitting: Emergency Medicine

## 2020-08-15 ENCOUNTER — Telehealth (INDEPENDENT_AMBULATORY_CARE_PROVIDER_SITE_OTHER): Payer: Self-pay | Admitting: Pediatrics

## 2020-08-15 ENCOUNTER — Emergency Department (HOSPITAL_COMMUNITY)
Admission: EM | Admit: 2020-08-15 | Discharge: 2020-08-15 | Disposition: A | Discharge status: Home / Self Care | Payer: 59 | Attending: Emergency Medicine | Admitting: Emergency Medicine

## 2020-08-15 ENCOUNTER — Emergency Department (HOSPITAL_COMMUNITY): Payer: 59

## 2020-08-15 ENCOUNTER — Other Ambulatory Visit: Payer: Self-pay

## 2020-08-15 DIAGNOSIS — Z20822 Contact with and (suspected) exposure to covid-19: Secondary | ICD-10-CM | POA: Diagnosis not present

## 2020-08-15 DIAGNOSIS — R569 Unspecified convulsions: Secondary | ICD-10-CM | POA: Diagnosis present

## 2020-08-15 DIAGNOSIS — G40209 Localization-related (focal) (partial) symptomatic epilepsy and epileptic syndromes with complex partial seizures, not intractable, without status epilepticus: Secondary | ICD-10-CM

## 2020-08-15 DIAGNOSIS — Z79899 Other long term (current) drug therapy: Secondary | ICD-10-CM | POA: Diagnosis not present

## 2020-08-15 DIAGNOSIS — G40309 Generalized idiopathic epilepsy and epileptic syndromes, not intractable, without status epilepticus: Secondary | ICD-10-CM

## 2020-08-15 DIAGNOSIS — I10 Essential (primary) hypertension: Secondary | ICD-10-CM | POA: Diagnosis not present

## 2020-08-15 LAB — CBC WITH DIFFERENTIAL/PLATELET
Abs Immature Granulocytes: 0.45 10*3/uL — ABNORMAL HIGH (ref 0.00–0.07)
Basophils Absolute: 0.1 10*3/uL (ref 0.0–0.1)
Basophils Relative: 0 %
Eosinophils Absolute: 0 10*3/uL (ref 0.0–0.5)
Eosinophils Relative: 0 %
HCT: 53.8 % — ABNORMAL HIGH (ref 39.0–52.0)
Hemoglobin: 17.4 g/dL — ABNORMAL HIGH (ref 13.0–17.0)
Immature Granulocytes: 2 %
Lymphocytes Relative: 7 %
Lymphs Abs: 1.6 10*3/uL (ref 0.7–4.0)
MCH: 26.2 pg (ref 26.0–34.0)
MCHC: 32.3 g/dL (ref 30.0–36.0)
MCV: 80.9 fL (ref 80.0–100.0)
Monocytes Absolute: 1.7 10*3/uL — ABNORMAL HIGH (ref 0.1–1.0)
Monocytes Relative: 7 %
Neutro Abs: 20.2 10*3/uL — ABNORMAL HIGH (ref 1.7–7.7)
Neutrophils Relative %: 84 %
Platelets: 339 10*3/uL (ref 150–400)
RBC: 6.65 MIL/uL — ABNORMAL HIGH (ref 4.22–5.81)
RDW: 14.1 % (ref 11.5–15.5)
WBC: 24 10*3/uL — ABNORMAL HIGH (ref 4.0–10.5)
nRBC: 0 % (ref 0.0–0.2)

## 2020-08-15 LAB — CBG MONITORING, ED: Glucose-Capillary: 148 mg/dL — ABNORMAL HIGH (ref 70–99)

## 2020-08-15 LAB — BASIC METABOLIC PANEL
Anion gap: 13 (ref 5–15)
BUN: 14 mg/dL (ref 6–20)
CO2: 20 mmol/L — ABNORMAL LOW (ref 22–32)
Calcium: 10 mg/dL (ref 8.9–10.3)
Chloride: 106 mmol/L (ref 98–111)
Creatinine, Ser: 1.28 mg/dL — ABNORMAL HIGH (ref 0.61–1.24)
GFR, Estimated: >60 mL/min (ref >60)
Glucose, Bld: 131 mg/dL — ABNORMAL HIGH (ref 70–99)
Potassium: 4.1 mmol/L (ref 3.5–5.1)
Sodium: 139 mmol/L (ref 135–145)

## 2020-08-15 LAB — RESP PANEL BY RT-PCR (FLU A&B, COVID) ARPGX2
Influenza A by PCR: NEGATIVE
Influenza B by PCR: NEGATIVE
SARS Coronavirus 2 by RT PCR: NEGATIVE

## 2020-08-15 LAB — RAPID URINE DRUG SCREEN, HOSP PERFORMED
Amphetamines: NOT DETECTED
Barbiturates: NOT DETECTED
Benzodiazepines: NOT DETECTED
Cocaine: NOT DETECTED
Opiates: NOT DETECTED
Tetrahydrocannabinol: NOT DETECTED

## 2020-08-15 MED ORDER — SODIUM CHLORIDE 0.9 % IV BOLUS
1000.0000 mL | Freq: Once | INTRAVENOUS | Status: AC
  Administered 2020-08-15: 1000 mL via INTRAVENOUS

## 2020-08-15 MED ORDER — OXCARBAZEPINE 600 MG PO TABS: ORAL_TABLET | ORAL | 3 refills | Status: DC

## 2020-08-15 NOTE — Telephone Encounter (Signed)
Patient is in the emergency department at this time.  He had a breakthrough seizure.  He is recovered.  A CT scan was performed and was negative.  We are tapering levetiracetam his oxcarbazepine dose is low.  Recommended increasing oxcarbazepine to 1-1/2 tablets in the morning and 2 at nighttime.  We may need to see him sooner than June depending upon the frequency of his seizures.  The ED doctor said that he was not sleep deprived and had been compliant with his medication.  He also is not sick.  He has an elevated white count which I think he is related to the stress of seizures.

## 2020-08-15 NOTE — Discharge Instructions (Addendum)
You were seen in the emergency department, you had a breakthrough seizure.  Your neurologist was contacted.  He recommends that you continue the Keppra at the prescribed dose.  For the Trileptal you will take 1-1/2 tabs in the morning and take full 2 tabs at nighttime.  A new prescription will be called in by Dr. Sharene Skeans.  If you have any worsening symptoms or further concerns for health please return to emergency department.

## 2020-08-15 NOTE — ED Notes (Signed)
Pt transported to CT ?

## 2020-08-15 NOTE — ED Triage Notes (Signed)
Pt arrives to ED via [facility] from home with c/o seizure. Per EMS, seizure was unwitnessed until the end when pt was in a clinic phase and post ictal. Pt with hx of epilepsy and neurologist currently lowering his dosage. Pt with no obvious injury to face or tongue, was not incontinent during vent. Pt lethargic, uncooperative, and unable to follow commands in triage.

## 2020-08-15 NOTE — ED Notes (Signed)
Pt's mother at besdide

## 2020-08-15 NOTE — ED Provider Notes (Signed)
[facility] EMERGENCY DEPARTMENT Provider Note   CSN: [id number] Arrival date & time: 08/15/20  1056     History Chief Complaint  Patient presents with  . Seizures    Isaiah Knapp is a 25 y.o. male.  HPI   25 year old male with past medical history of epilepsy presents to the emergency department by EMS for reported seizure.  Mom is at bedside.  She states he was up in his room when she went to go check on him, found him on his back with whole body shaking.  Patient follows with Dr. Sharene Skeans for neurology.  He is currently on Keppra and Trileptal.  Recently they are trying to wean the Keppra off.  Patient has had breakthrough seizures before, does usually have a prolonged postictal phase.  No recent vomiting or acute illness per the mom.  Patient is reported to have been compliant with his medications.  Past Medical History:  Diagnosis Date  . Hypertension   . Seizure (HCC) 2001   from head trauma    Patient Active Problem List   Diagnosis Date Noted  . Epilepsy, generalized, convulsive (HCC) 09/02/2017  . Obese 11/01/2013  . Partial epilepsy with impairment of consciousness (HCC) 11/26/2012  . Abnormality of gait 11/26/2012  . Mild intellectual disability 11/26/2012  . Long-term use of high-risk medication 11/26/2012    History reviewed. No pertinent surgical history.     Family History  Problem Relation Age of Onset  . Heart attack Maternal Grandfather        Died at the age of 18  . Heart attack Paternal Grandfather   . Breast cancer Paternal Grandmother     Social History   Tobacco Use  . Smoking status: Never Smoker  . Smokeless tobacco: Never Used  Substance Use Topics  . Alcohol use: No  . Drug use: No    Home Medications Prior to Admission medications   Medication Sig Start Date End Date Taking? Authorizing Provider  amLODipine (NORVASC) 5 MG tablet Take 5 mg by mouth every morning. 12/16/18   [provider]   Levetiracetam 750 MG TB24 TAKE 2 TABLETS BY MOUTH  TWICE DAILY 02/29/20   Deetta Perla, MD  Multiple Vitamins-Minerals (HM MULTIVITAMIN ADULT GUMMY PO) Take 1 each by mouth every morning.    [provider]  oxcarbazepine (TRILEPTAL) 600 MG tablet TAKE 1 AND 1/2 TABLETS BY  MOUTH TWICE DAILY 07/27/20   Deetta Perla, MD    Allergies    Patient has no known allergies.  Review of Systems   Review of Systems  Unable to perform ROS: Acuity of condition    Physical Exam Updated Vital Signs BP 118/62   Pulse (!) 117   Temp 99.2 F (37.3 C) (Oral)   Resp (!) 29   SpO2 98%   Physical Exam Vitals and nursing note reviewed.  Constitutional:      Appearance: Normal appearance.  HENT:     Head: Normocephalic.     Mouth/Throat:     Mouth: Mucous membranes are dry.     Comments: Bite marks on the lateral tongue, no active bleeding Eyes:     Pupils: Pupils are equal, round, and reactive to light.  Cardiovascular:     Rate and Rhythm: Normal rate.  Pulmonary:     Effort: Pulmonary effort is normal. No respiratory distress.     Comments: Protecting his airway Abdominal:     Palpations: Abdomen is soft.  Tenderness: There is no abdominal tenderness.  Musculoskeletal:        General: No deformity.  Skin:    General: Skin is warm.  Neurological:     Mental Status: He is alert and oriented to person, place, and time. Mental status is at baseline.  Psychiatric:        Mood and Affect: Mood normal.     ED Results / Procedures / Treatments   Labs (all labs ordered are listed, but only abnormal results are displayed) Labs Reviewed  CBG MONITORING, ED - Abnormal; Notable for the following components:      Result Value   Glucose-Capillary 148 (*)    All other components within normal limits  CBC WITH DIFFERENTIAL/PLATELET  BASIC METABOLIC PANEL  LEVETIRACETAM LEVEL    EKG None  Radiology No results found.  Procedures Procedures (including critical  care time)  Medications Ordered in ED Medications  sodium chloride 0.9 % bolus 1,000 mL (1,000 mLs Intravenous New Bag/Given 08/15/20 1132)    ED Course  I have reviewed the triage vital signs and the nursing notes.  Pertinent labs & imaging results that were available during my care of the patient were reviewed by me and considered in my medical decision making (see chart for details).    MDM Rules/Calculators/A&P                          25 year old male presents the emergency department with what appears to be a breakthrough seizure.  Currently the neurologist is tapering down his Keppra.  Patient has been compliant with his medications.  Denies any acute illness, vomiting, drug use.  Head CT is unremarkable, blood work shows a leukocytosis but is otherwise normal.  After hydration the patient has returned to baseline mental status.  Mom is at bedside.  Spoke with his neurologist Dr. Sharene Skeans, we have reviewed his work-up.  He recommends keeping the Keppra dose the same, I ordered a Trileptal level and he has increased his Trileptal level dose.  He has sent a new prescription to the patient's pharmacy.  On reevaluation patient is neurologically intact and back to baseline.  No further seizure-like activity, vitals are stable.  Patient will be discharged for outpatient neurology follow-up.   Discharge plan and strict return to ED precautions discussed, patient verbalizes understanding and agreement.    Final Clinical Impression(s) / ED Diagnoses Final diagnoses:  None    Rx / DC Orders ED Discharge Orders    None       Rozelle Logan, DO 08/15/20 1526

## 2020-08-18 LAB — LEVETIRACETAM LEVEL: Levetiracetam Lvl: 10.1 ug/mL (ref 10.0–40.0)

## 2020-08-21 LAB — 10-HYDROXYCARBAZEPINE: Triliptal/MTB(Oxcarbazepin): 2 ug/mL — ABNORMAL LOW (ref 10–35)

## 2020-09-14 ENCOUNTER — Encounter (INDEPENDENT_AMBULATORY_CARE_PROVIDER_SITE_OTHER): Payer: Self-pay

## 2020-09-14 DIAGNOSIS — G40209 Localization-related (focal) (partial) symptomatic epilepsy and epileptic syndromes with complex partial seizures, not intractable, without status epilepticus: Secondary | ICD-10-CM

## 2020-09-14 DIAGNOSIS — G40309 Generalized idiopathic epilepsy and epileptic syndromes, not intractable, without status epilepticus: Secondary | ICD-10-CM

## 2020-09-14 MED ORDER — OXCARBAZEPINE 600 MG PO TABS: ORAL_TABLET | ORAL | 3 refills | Status: DC

## 2020-09-15 NOTE — Telephone Encounter (Signed)
Thank you, I agree with this plan.

## 2020-12-03 ENCOUNTER — Encounter (INDEPENDENT_AMBULATORY_CARE_PROVIDER_SITE_OTHER): Payer: Self-pay

## 2021-01-25 ENCOUNTER — Encounter (INDEPENDENT_AMBULATORY_CARE_PROVIDER_SITE_OTHER): Payer: Self-pay | Admitting: Pediatrics

## 2021-01-25 ENCOUNTER — Ambulatory Visit (INDEPENDENT_AMBULATORY_CARE_PROVIDER_SITE_OTHER): Payer: 59 | Admitting: Pediatrics

## 2021-01-25 ENCOUNTER — Other Ambulatory Visit: Payer: Self-pay

## 2021-01-25 VITALS — BP 140/90 | HR 84 | Ht 69.5 in | Wt 280.0 lb

## 2021-01-25 DIAGNOSIS — G40209 Localization-related (focal) (partial) symptomatic epilepsy and epileptic syndromes with complex partial seizures, not intractable, without status epilepticus: Secondary | ICD-10-CM | POA: Diagnosis not present

## 2021-01-25 DIAGNOSIS — Z6839 Body mass index (BMI) 39.0-39.9, adult: Secondary | ICD-10-CM

## 2021-01-25 DIAGNOSIS — G40309 Generalized idiopathic epilepsy and epileptic syndromes, not intractable, without status epilepticus: Secondary | ICD-10-CM | POA: Diagnosis not present

## 2021-01-25 DIAGNOSIS — E6609 Other obesity due to excess calories: Secondary | ICD-10-CM | POA: Diagnosis not present

## 2021-01-25 DIAGNOSIS — F70 Mild intellectual disabilities: Secondary | ICD-10-CM

## 2021-01-25 NOTE — Patient Instructions (Signed)
At [facility], we are committed to providing exceptional care. You will receive a patient satisfaction survey through text or email regarding your visit today. Your opinion is important to me. Comments are appreciated.   It has been a pleasure to care for [place] over these years.  I am pleased that oxcarbazepine is working to control his seizures without significant side effects.  I want him to continue to work out daily and to be careful about what he eats.  I will retire April 27, 2021.  I am referring him to Dr. Shon Millet, a neurologist at [facility].  Shahir is in good hands.  If you have not heard from the practice over the next couple of weeks contact them the information is in your after visit summary.

## 2021-01-25 NOTE — Progress Notes (Signed)
Patient: Isaiah Knapp MRN: [id number] Sex: male DOB: Dec 18, 1995  Provider: Ellison Carwin, MD Location of Care: [facility]  Note type: Routine return visit  History of Present Illness: Referral Source: Kandyce Rud, MD History from: mother, patient, and [facility] chart Chief Complaint: Epilepsy  Isaiah Knapp is a 25 y.o. male who was evaluated January 25, 2021 for the first time since July 27, 2020.  He has focal epilepsy with impairment of consciousness and occasional secondary generalization.  He suffered a closed head injury at 25 years of age which may be responsible for his seizures.  When he came to see me I pushed levetiracetam.  He continued to average 1 generalized tonic-clonic seizure a month.  Oxcarbazepine was added Dec 20, 2019 and the frequency of his seizures declined.  This allowed [place] to taper and discontinue levetiracetam.  He had a single breakthrough seizure August 15, 2020 that led to an emergency department visit.  Oxcarbazepine was increased at that time and there have been no further seizures.  He is gained 8 pounds since his last visit which combined with a 10 pound weight loss the previous visit suggests fairly good weight stability over the past year.  He walks on a treadmill at home 2 miles a day.  During the school year he works in [employer] at [employer].  There is also some summer employment but not steadily.  Other than his obesity, his health is good.  He has a good sleep-wake pattern.  He is never contracted COVID.  He has been vaccinated and boosted.  Review of Systems: A complete review of systems was remarkable for patient is here to be seen for epilepsy. Mom reports that the patient has not had any seizures since his last visit. She reports no concerns at this time, all other systems reviewed and negative.  Past Medical History Diagnosis Date   Hypertension    Seizure (HCC) 2001   from head trauma    Hospitalizations: No., Head Injury: No., Nervous System Infections: No., Immunizations up to date: Yes.    Copied from previous record notes Patient was in NICU for 3 months after birth due to being born at 61 weeks.   He suffered a head injury at the age of 4 jumping from a bed falling off and hitting his head on the bed frame, he was treated at [facility] in [place]. Patient suffered a head injury at the age of 25 years old from a fall and stayed for 3 to 4 days at [facility] in [place].   Records from [facility] in [place], [place] were reviewed. They mention that for the most part seizure control was good. He was prescribed both Tegretol and Carbatrol. He was noted to have developmental delay. Mention was made that he was followed by neurology. He had mild hyperreflexia in his lower extremities including ankle clonus he had tight Achilles tendons.   EEG performed February 10, 2003 showed frequent independent and synchronous frontal polar spike and spike and wave discharges at times occurring on multiple occasions throughout each 10 second epoch Predominance shifted between hemispheres. At times they spread over the paracentral regions posteriorly right greater than left. Background activity appeared otherwise normal. There were no photoparoxysmal response was. Seizure activity was more prominent when the patient was asleep than awake.     EEG performed July 31, 2011 with a normal record with the patient awake. He tapered off Carbatrol in November 2014  after Keppra was added to his regimen.   EEG September 01, 2017 was normal record awake, drowsy, and asleep.  A normal EEG does not rule out the presence of seizures.   CT brain March 09, 2019 showed normal brain with a calcification along the right tentorial falx 1.3 x 0.8 cm.   EEG March 22, 2019 showed right frontotemporal diphasic sharply contoured slow wave activity.  Background activity was otherwise normal with  the patient awake, drowsy, and asleep   Laboratory studies performed January 21, 2020 showed a 10 hydroxycarbazepine level of 17.6 mcg/mL which is solidly in the therapeutic range.  Simultaneous studies of his CBC were normal other than an MCV of 77.8.  Sodium was normal at 138.   Behavior History None  Surgical History History reviewed. No pertinent surgical history.  Family History family history includes Breast cancer in his paternal grandmother; Heart attack in his maternal grandfather and paternal grandfather. Family history is negative for migraines, seizures, intellectual disabilities, blindness, deafness, birth defects, chromosomal disorder, or autism.  Social History Socioeconomic History   Marital status: Single   Years of education: 13   Highest education level: High school graduate  Occupational History   Employed washing dishes at [employer]  Tobacco Use   Smoking status: Never   Smokeless tobacco: Never  Substance and Sexual Activity   Alcohol use: No   Drug use: No   Sexual activity: Never  Social History Narrative   Isaiah Knapp graduated from [employer] (internship); he does well in both. He is off work for the summer but will return in the fall. He lives with his mother. He enjoys puzzles, lego building, and watching TV.   No Known Allergies  Physical Exam BP 140/90   Pulse 84   Ht 5' 9.5" (1.765 m)   Wt 280 lb (127 kg)   BMI 40.76 kg/m   General: alert, well developed, obese, in no acute distress, brown hair, brown eyes, right handed Head: normocephalic, no dysmorphic features Ears, Nose and Throat: Otoscopic: tympanic membranes normal; pharynx: oropharynx is pink without exudates or tonsillar hypertrophy Neck: supple, full range of motion, no cranial or cervical bruits Respiratory: auscultation clear Cardiovascular: no murmurs, pulses are normal Musculoskeletal: no skeletal deformities or apparent scoliosis Skin: no rashes or  neurocutaneous lesions  Neurologic Exam  Mental Status: alert; oriented to person, place and year; knowledge is normal for age; language is normal Cranial Nerves: visual fields are full to double simultaneous stimuli; extraocular movements are full and conjugate; pupils are round reactive to light; funduscopic examination shows sharp disc margins with normal vessels; symmetric facial strength; midline tongue and uvula; air conduction is greater than bone conduction bilaterally Motor: Normal strength, tone and mass; good fine motor movements; no pronator drift Sensory: intact responses to cold, vibration, proprioception and stereognosis Coordination: good finger-to-nose, rapid repetitive alternating movements and finger apposition Gait and Station: normal gait and station: patient is able to walk on heels, toes and tandem without difficulty; balance is adequate; Romberg exam is negative; Gower response is negative Reflexes: symmetric and diminished bilaterally; no clonus; bilateral flexor plantar responses   Assessment 1.  Focal epilepsy with impairment of consciousness, G40.209. 2.  Generalized convulsive epilepsy, G 40.309. 3.  Mild intellectual disability, F70. 4.  Obesity, E66.9.  Discussion I am pleased that Kenyon seizures have been in good control since January.  I am a little concerned that he is gaining weight.  I strongly urged him to continue  his physical activity and to watch his portion control.  I am pleased that he is gainfully employed.  Plan I refilled his prescription for oxcarbazepine.  His care will be transferred to Dr. Shon Millet at [facility].  I hope that he can be seen before I retire April 27, 2021 in case there are any questions or concerns.  Greater than 50% of a 30-minute visit was spent in counseling coordination of care concerning his seizures and discussing transition of care issues.  We also talked about trying to maintain or lose weight.    Medication List    Accurate as of January 25, 2021  4:58 PM. If you have any questions, ask your nurse or doctor.     TAKE these medications    HM MULTIVITAMIN ADULT GUMMY PO Take 1 each by mouth every morning.   oxcarbazepine 600 MG tablet Commonly known as: TRILEPTAL Take 2 tablets by mouth in the morning and 2 tablets by mouth at nighttime     The medication list was reviewed and reconciled. All changes or newly prescribed medications were explained.  A complete medication list was provided to the patient/caregiver.  Deetta Perla MD

## 2021-02-01 ENCOUNTER — Encounter: Payer: Self-pay | Admitting: Neurology

## 2021-04-23 NOTE — Progress Notes (Deleted)
NEUROLOGY CONSULTATION NOTE  Isaiah Knapp MRN: [id number] DOB: April 02, 1996  Referring provider: Jarrett Soho, PA-C Primary care provider: Jarrett Soho, PA-C  Reason for consult:  seizure disorder  Assessment/Plan:   Focal epilepsy with impairment of consciousness Generalized convulsive epilepsy Mild intellectual disability  ***    Subjective:  Isaiah Knapp is a 25 year old ***-handed male with HTN who presents to establish care for seizure disorder.  History supplemented by prior neurologist's notes.  Patient developed focal epilepsy with impairment of consciousness and occasional secondary generalization ***, possibly stemming from a closed head injury at age 25 years old.  ***  Patient moved to [place] from [place].  Records at that time revealed that he has developmental delay and that he exhibited mild hyperreflexia in the lower extremities on exam, including ankle clonus.    EEG: 02/10/2003:  frequent independent and synchronous frontal polar spike and spike and wave discharges at times occurring on multiple occasions throughout each 10 second epoch Predominance shifted between hemispheres. At times they spread over the paracentral regions posteriorly right greater than left. Background activity appeared otherwise normal. There were no photoparoxysmal response was. Seizure activity was more prominent when the patient was asleep than awake.   07/31/2011:  normal record with the patient awake 09/01/2017:  normal record awake, drowsy, and asleep. 03/22/2019:  right frontotemporal diphasic sharply contoured slow wave activity.  Background activity was otherwise normal with the patient awake, drowsy, and asleep  Imaging (personally reviewed): 03/09/2019 CT HEAD:  Brain parenchyma appears unremarkable. No mass effect or hemorrhage. Calcification along the right tentorium appears benign without mass effect or edema. 08/15/2020 CT HEAD WO:  No evidence of acute  intracranial abnormality  Current anticonvulsant:  Oxcarbazepine *** Past anticonvulsant:  Levetiracetam, Carbatrol, Tegretol     PAST MEDICAL HISTORY: Past Medical History:  Diagnosis Date   Hypertension    Seizure (HCC) 2001   from head trauma    PAST SURGICAL HISTORY: No past surgical history on file.  MEDICATIONS: Current Outpatient Medications on File Prior to Visit  Medication Sig Dispense Refill   Multiple Vitamins-Minerals (HM MULTIVITAMIN ADULT GUMMY PO) Take 1 each by mouth every morning.     oxcarbazepine (TRILEPTAL) 600 MG tablet Take 2 tablets by mouth in the morning and 2 tablets by mouth at nighttime 360 tablet 3   No current facility-administered medications on file prior to visit.    ALLERGIES: No Known Allergies  FAMILY HISTORY: Family History  Problem Relation Age of Onset   Heart attack Maternal Grandfather        Died at the age of 75   Heart attack Paternal Grandfather    Breast cancer Paternal Grandmother     Objective:  *** General: No acute distress.  Patient appears well-groomed.   Head:  Normocephalic/atraumatic Eyes:  fundi examined but not visualized Neck: supple, no paraspinal tenderness, full range of motion Back: No paraspinal tenderness Heart: regular rate and rhythm Lungs: Clear to auscultation bilaterally. Vascular: No carotid bruits. Neurological Exam: Mental status: alert and oriented to person, place, and time, recent and remote memory intact, fund of knowledge intact, attention and concentration intact, speech fluent and not dysarthric, language intact. Cranial nerves: CN I: not tested CN II: pupils equal, round and reactive to light, visual fields intact CN III, IV, VI:  full range of motion, no nystagmus, no ptosis CN V: facial sensation intact. CN VII: upper and lower face symmetric CN VIII: hearing intact CN IX, X: gag intact, uvula  midline CN XI: sternocleidomastoid and trapezius muscles intact CN XII: tongue  midline Bulk & Tone: normal, no fasciculations. Motor:  muscle strength 5/5 throughout Sensation:  Pinprick, temperature and vibratory sensation intact. Deep Tendon Reflexes:  2+ throughout,  toes downgoing.   Finger to nose testing:  Without dysmetria.   Heel to shin:  Without dysmetria.   Gait:  Normal station and stride.  Romberg negative.    Thank you for allowing me to take part in the care of this patient.  Shon Millet, DO  CC: ***

## 2021-04-24 ENCOUNTER — Ambulatory Visit: Payer: 59 | Admitting: Neurology

## 2021-05-28 NOTE — Progress Notes (Signed)
NEUROLOGY CONSULTATION NOTE  Isaiah Knapp MRN: [id number] DOB: April 01, 1996  Referring provider: Jarrett Soho, PA-C Primary care provider: Jarrett Soho, PA-C  Reason for consult:  seizure  Assessment/Plan:   Focal epilepsy with impairment of consciousness, symptomatic Generalized convulsive epilepsy Mild intellectual disability   1  Will check routine labs - CBC, CMP, and trough oxcarbazepine metabolite level.   2.  To optimize seizure control, will restart levetiracetam (reports no side effects to it).  Start 500mg  twice daily.  Continue oxcarbazepine 1200mg  twice daily. 3  Discussed that he should not drive. 4  Follow up 6 months.   Subjective:  Isaiah Knapp is a 25 year old right-handed male who presents to establish care for seizure disorder.  History supplemented by prior neurologist's notes.  He is accompanied by his mother.    Etiology is presumed to be a closed head injury suffered at age 18 years old when he fell off of the bed striking his head on the bed frame.  He had a seizure the next day.  He was hospitalized for 3 months.  He was fine for the next 9 months until he had his second seizure.    Initially, semiology of seizures presenting as generalized tonic-clonic with loss of consciousness and convulsions for 5 minutes followed by postictal fatigue for 1 to 2 days.  Since being on medication, they present without loss of consciousness but he appears altered with staring, moaning and repeating phrases. No falls.  Lasts 1 to 2 minutes without postictal state.    On average, seizures occur every 2 to 3 months.  At its most controlled, they average every 6 months.  Most recent change in medication management occurred in January 2022, in which oxcarbazepine was increased and he was tapered off of levetiracetam.  Since then, he has had about 4 or 5 seizures.  Last seizure was 2 months ago.  Reports no side effects to oxcarbazepine or levetiracetam.   Current  anticonvulsant therapy:  oxcarbazepine 1200mg  twice daily Past anticonvulsant therapy:  Levetiracetam, carbamazepine  He was born premature at 28 weeks, requiring stay in the NICU for 3 months.  He has diagnosed with mild intellectual disability.  He does not drive.  He works in [employer] at [birth year][employer]31-1987.  He does not drive.  No family history of seizures.    Testing: 08/15/2020 CT HEAD (personally reviewed):  No evidence of acute intracranial abnormality. 03/22/2019 EEG:  right frontotemporal diphasic sharply contoured slow wave activity.  Background activity was otherwise normal with the patient awake, drowsy, and asleep. 03/09/2019 CT HEAD (personally reviewed):  normal brain with a caclification along the right tentorial falx 1.3 x 0.8 cm 09/01/2017 EEG:  normal record awake, drowsy and asleep.   07/31/2011 EEG:  normal record with the patient awake. 02/10/2003 EEG:  frequent independent and synchronous frontal polar spike and spike and wave discharges at times occurring on multiple occasions throughout each 10 second epoch Predominance shifted between hemispheres. At times they spread over the paracentral regions posteriorly right greater than left. Background activity appeared otherwise normal. There were no photoparoxysmal response was. Seizure activity was more prominent when the patient was asleep than awake.     PAST MEDICAL HISTORY: Past Medical History:  Diagnosis Date   Hypertension    Seizure (HCC) 2001   from head trauma    PAST SURGICAL HISTORY: No past surgical history on file.  MEDICATIONS: Current Outpatient Medications on File Prior to Visit  Medication Sig  Dispense Refill   Multiple Vitamins-Minerals (HM MULTIVITAMIN ADULT GUMMY PO) Take 1 each by mouth every morning.     oxcarbazepine (TRILEPTAL) 600 MG tablet Take 2 tablets by mouth in the morning and 2 tablets by mouth at nighttime 360 tablet 3   No current facility-administered medications on file  prior to visit.    ALLERGIES: No Known Allergies  FAMILY HISTORY: Family History  Problem Relation Age of Onset   Heart attack Maternal Grandfather        Died at the age of 82   Heart attack Paternal Grandfather    Breast cancer Paternal Grandmother     Objective:  Blood pressure 138/85, pulse 92, height 5' 9.5" (1.765 m), weight 283 lb (128.4 kg), SpO2 97 %. General: No acute distress.  Patient appears well-groomed.   Head:  Normocephalic/atraumatic Eyes:  fundi examined but not visualized Neck: supple, no paraspinal tenderness, full range of motion Back: No paraspinal tenderness Heart: regular rate and rhythm Lungs: Clear to auscultation bilaterally. Vascular: No carotid bruits. Neurological Exam: Mental status: alert and oriented to person, place, and time, recent and remote memory intact, fund of knowledge intact, attention and concentration intact, speech fluent and not dysarthric, language intact. Cranial nerves: CN I: not tested CN II: pupils equal, round and reactive to light, visual fields intact CN III, IV, VI:  full range of motion, no nystagmus, no ptosis CN V: facial sensation intact. CN VII: upper and lower face symmetric CN VIII: hearing intact CN IX, X: gag intact, uvula midline CN XI: sternocleidomastoid and trapezius muscles intact CN XII: tongue midline Bulk & Tone: normal, no fasciculations. Motor:  muscle strength 5/5 throughout Sensation:  Pinprick, temperature and vibratory sensation intact. Deep Tendon Reflexes:  2+ throughout,  toes downgoing.   Finger to nose testing:  Without dysmetria.   Heel to shin:  Without dysmetria.   Gait:  Normal station and stride.  Romberg negative.    Thank you for allowing me to take part in the care of this patient.  Shon Millet, DO  CC: Jarrett Soho, PA-C

## 2021-05-29 ENCOUNTER — Ambulatory Visit: Payer: 59 | Admitting: Neurology

## 2021-05-29 ENCOUNTER — Other Ambulatory Visit: Payer: Self-pay

## 2021-05-29 ENCOUNTER — Encounter: Payer: Self-pay | Admitting: Neurology

## 2021-05-29 VITALS — BP 138/85 | HR 92 | Ht 69.5 in | Wt 283.0 lb

## 2021-05-29 DIAGNOSIS — F70 Mild intellectual disabilities: Secondary | ICD-10-CM | POA: Diagnosis not present

## 2021-05-29 DIAGNOSIS — Z79899 Other long term (current) drug therapy: Secondary | ICD-10-CM

## 2021-05-29 DIAGNOSIS — G40209 Localization-related (focal) (partial) symptomatic epilepsy and epileptic syndromes with complex partial seizures, not intractable, without status epilepticus: Secondary | ICD-10-CM | POA: Diagnosis not present

## 2021-05-29 MED ORDER — LEVETIRACETAM 500 MG PO TABS: 500.0000 mg | ORAL_TABLET | Freq: Two times a day (BID) | ORAL | 5 refills | Status: DC

## 2021-05-29 NOTE — Addendum Note (Signed)
Addended by: Karl Luke A on: 05/29/2021 08:22 AM   Modules accepted: Orders

## 2021-05-29 NOTE — Patient Instructions (Signed)
We will restart levetiracetam 500mg  twice daily Continue oxcarbazepine 1200mg  twice daily Check CBC, CMP, and trough oxcarbazepine level.  Get the labs done earliest on the morning right before taking your medication.  Bring the medication with you (oxcarbazepine) and take it right after the labs are drawn. Follow up 6 months.

## 2021-08-19 ENCOUNTER — Other Ambulatory Visit (INDEPENDENT_AMBULATORY_CARE_PROVIDER_SITE_OTHER): Payer: Self-pay | Admitting: Family

## 2021-08-19 DIAGNOSIS — G40209 Localization-related (focal) (partial) symptomatic epilepsy and epileptic syndromes with complex partial seizures, not intractable, without status epilepticus: Secondary | ICD-10-CM

## 2021-08-19 DIAGNOSIS — G40309 Generalized idiopathic epilepsy and epileptic syndromes, not intractable, without status epilepticus: Secondary | ICD-10-CM

## 2021-08-21 ENCOUNTER — Telehealth: Payer: Self-pay | Admitting: Neurology

## 2021-08-21 DIAGNOSIS — G40309 Generalized idiopathic epilepsy and epileptic syndromes, not intractable, without status epilepticus: Secondary | ICD-10-CM

## 2021-08-21 DIAGNOSIS — G40209 Localization-related (focal) (partial) symptomatic epilepsy and epileptic syndromes with complex partial seizures, not intractable, without status epilepticus: Secondary | ICD-10-CM

## 2021-08-21 NOTE — Telephone Encounter (Signed)
1. Which medications need refilled? (List name and dosage, if known) generic Keppra, 750 MG and generic Trileptal, 600 MG.  2. Which pharmacy/location is medication to be sent to? (include street and city if local pharmacy) [facility]  3. Do they need a 30 day or 90 day supply? 90 day increment  Patient's mom Isaiah Knapp stated Dr. Everlena Cooper is the patient's only neurologist now but for some reason the pharmacy had his old offices's PA on the Rx.

## 2021-08-22 MED ORDER — LEVETIRACETAM 500 MG PO TABS: 500.0000 mg | ORAL_TABLET | Freq: Two times a day (BID) | ORAL | 1 refills | Status: DC

## 2021-08-22 MED ORDER — OXCARBAZEPINE 600 MG PO TABS: ORAL_TABLET | ORAL | 1 refills | Status: DC

## 2021-08-22 NOTE — Telephone Encounter (Signed)
Per pt mother please 90 day supply for both the Keppra and Trileptal to [facility] in pharmacy.   Refills sent.

## 2021-08-22 NOTE — Telephone Encounter (Signed)
LMOVM, 3. Do they need a 30 day or 90 day supply? should the keppra be changed to a 90 day increment. A refill was sent 05/2021. As for the Trileptal it looks like it will need a refill and we should be able to send a 90 day supply.

## 2021-08-27 ENCOUNTER — Telehealth: Payer: Self-pay | Admitting: Neurology

## 2021-08-27 NOTE — Telephone Encounter (Signed)
Advised pt mother both scripts sent at the same time on 08/22/21. Please check to see if they got the Trileptal. If not let me know and I can resend it.

## 2021-08-27 NOTE — Telephone Encounter (Signed)
Patients mother called in for a refill for levetiracetam, her son is getting low.

## 2021-09-21 DIAGNOSIS — Z0279 Encounter for issue of other medical certificate: Secondary | ICD-10-CM

## 2021-10-04 ENCOUNTER — Telehealth: Payer: Self-pay | Admitting: Neurology

## 2021-10-04 NOTE — Telephone Encounter (Signed)
Formed fill out with the patient mother on the phone waiting on DR.Jaffe to return to sign form. ?

## 2021-10-04 NOTE — Telephone Encounter (Signed)
Patients mom Florentina Jenny called and stated she was waiting on forms to be filled out for insurance purposes.  She was following up. ?

## 2021-10-10 ENCOUNTER — Telehealth: Payer: Self-pay | Admitting: Neurology

## 2021-10-10 NOTE — Telephone Encounter (Signed)
Patients mother Florentina Jenny called about [employer] paperwork. Wants to know if it is ready to pick up ?

## 2021-10-10 NOTE — Telephone Encounter (Signed)
Paperwork copied and at the front desk ?

## 2021-12-26 NOTE — Progress Notes (Unsigned)
NEUROLOGY FOLLOW UP OFFICE NOTE  Isaiah Knapp [id number]  Assessment/Plan:   Focal epilepsy with impaired consciousness, symptomatic Generalized convulsive epilepsy Mild intellectual disability.     1.  To further optimize seizure control, increase levetiracetam to 750mg  twice daily.  Continue oxcarbazepine 1200mg  twice daily 2.  Will check routine labs - CBC, CMP, and trough oxcarbazepine metabolite level and levetiracetam level.   3. Follow up 6 months.     Subjective:  Isaiah Knapp is a 26 year old right-handed male who follows up for seizure disorder.  He is accompanied by his mother.    UPDATE: Current medications:  oxcarbazepine 1200mg  BID, levetiracetam 500mg  BID  To optimize seizure control, restarted levetiracetam.  Seizures now last 5 to 10 seconds and occurring twice a month.  .  Last visit, labs were ordered but never performed.   HISTORY: Etiology is presumed to be a closed head injury suffered at age 48 years old when he fell off of the bed striking his head on the bed frame.  He had a seizure the next day.  He was hospitalized for 3 months.  He was fine for the next 9 months until he had his second seizure.     Initially, semiology of seizures presenting as generalized tonic-clonic with loss of consciousness and convulsions for 5 minutes followed by postictal fatigue for 1 to 2 days.  Since being on medication, they present without loss of consciousness but he appears altered with staring, moaning and repeating phrases. No falls.  Lasts 1 to 2 minutes without postictal state.     On average, seizures occur every 2 to 3 months.  At its most controlled, they average every 6 months.  Most recent change in medication management occurred in January 2022, in which oxcarbazepine was increased and he was tapered off of levetiracetam.  Since then, he has had about 4 or 5 seizures.  Last seizure was 2 months ago.  Reports no side effects to oxcarbazepine or levetiracetam.     Current anticonvulsant therapy:  oxcarbazepine 1200mg  twice daily Past anticonvulsant therapy:  Levetiracetam, carbamazepine   He was born premature at 28 weeks, requiring stay in the NICU for 3 months.  He has diagnosed with mild intellectual disability.  He does not drive.  He works in at .  He does not drive.  No family history of seizures.     Testing: 08/15/2020 CT HEAD (personally reviewed):  No evidence of acute intracranial abnormality. 03/22/2019 EEG:  right frontotemporal diphasic sharply contoured slow wave activity.  Background activity was otherwise normal with the patient awake, drowsy, and asleep. 03/09/2019 CT HEAD (personally reviewed):  normal brain with a caclification along the right tentorial falx 1.3 x 0.8 cm 09/01/2017 EEG:  normal record awake, drowsy and asleep.   07/31/2011 EEG:  normal record with the patient awake. 02/10/2003 EEG:  frequent independent and synchronous frontal polar spike and spike and wave discharges at times occurring on multiple occasions throughout each 10 second epoch Predominance shifted between hemispheres. At times they spread over the paracentral regions posteriorly right greater than left. Background activity appeared otherwise normal. There were no photoparoxysmal response was. Seizure activity was more prominent when the patient was asleep than awake.    PAST MEDICAL HISTORY: Past Medical History:  Diagnosis Date   Hypertension    Seizure (HCC) 2001   from head trauma    MEDICATIONS: Current Outpatient Medications on File Prior to Visit  Medication Sig  Dispense Refill   levETIRAcetam (KEPPRA) 500 MG tablet Take 1 tablet (500 mg total) by mouth 2 (two) times daily. 180 tablet 1   Multiple Vitamins-Minerals (HM MULTIVITAMIN ADULT GUMMY PO) Take 1 each by mouth every morning.     oxcarbazepine (TRILEPTAL) 600 MG tablet Take 2 tablets by mouth in the morning and 2 tablets by mouth at nighttime 360 tablet  1   No current facility-administered medications on file prior to visit.    ALLERGIES: No Known Allergies  FAMILY HISTORY: Family History  Problem Relation Age of Onset   Hypertension Mother    Hypothyroidism Mother    Heart attack Maternal Grandfather        Died at the age of 36   Breast cancer Paternal Grandmother    Heart attack Paternal Grandfather       Objective:  Blood pressure 126/79, pulse 98, height 6' (1.829 m), weight 281 lb (127.5 kg), SpO2 97 %. General: No acute distress.  Patient appears well-groomed.   Head:  Normocephalic/atraumatic Eyes:  Fundi examined but not visualized Neck: supple, no paraspinal tenderness, full range of motion Heart:  Regular rate and rhythm Lungs:  Clear to auscultation bilaterally Back: No paraspinal tenderness Neurological Exam: alert and oriented to person, place, and time.  Speech fluent and not dysarthric, language intact.  CN II-XII intact. Bulk and tone normal, muscle strength 5/5 throughout.  Sensation to light touch intact.  Deep tendon reflexes 2+ throughout.  Finger to nose testing intact.  Gait normal, Romberg negative.   Shon Millet, DO  CC: Jarrett Soho, PA-C

## 2021-12-27 ENCOUNTER — Ambulatory Visit: Payer: 59 | Admitting: Neurology

## 2021-12-27 ENCOUNTER — Encounter: Payer: Self-pay | Admitting: Neurology

## 2021-12-27 ENCOUNTER — Other Ambulatory Visit: Payer: Self-pay | Admitting: Neurology

## 2021-12-27 VITALS — BP 126/79 | HR 98 | Ht 72.0 in | Wt 281.0 lb

## 2021-12-27 DIAGNOSIS — G40309 Generalized idiopathic epilepsy and epileptic syndromes, not intractable, without status epilepticus: Secondary | ICD-10-CM | POA: Diagnosis not present

## 2021-12-27 DIAGNOSIS — G40209 Localization-related (focal) (partial) symptomatic epilepsy and epileptic syndromes with complex partial seizures, not intractable, without status epilepticus: Secondary | ICD-10-CM

## 2021-12-27 MED ORDER — LEVETIRACETAM 750 MG PO TABS: 750.0000 mg | ORAL_TABLET | Freq: Two times a day (BID) | ORAL | 5 refills | Status: DC

## 2021-12-27 NOTE — Patient Instructions (Signed)
Increase levetiracetam to 750mg  twice daily Continue current dose of oxcarbazepine Check CBC, CMP, levetiracetam and trough oxcarbazepine level Follow up 6 months.

## 2021-12-28 ENCOUNTER — Other Ambulatory Visit (INDEPENDENT_AMBULATORY_CARE_PROVIDER_SITE_OTHER): Payer: 59

## 2021-12-28 DIAGNOSIS — G40209 Localization-related (focal) (partial) symptomatic epilepsy and epileptic syndromes with complex partial seizures, not intractable, without status epilepticus: Secondary | ICD-10-CM

## 2021-12-28 LAB — COMPREHENSIVE METABOLIC PANEL WITH GFR
ALT: 23 U/L (ref 0–53)
AST: 16 U/L (ref 0–37)
Albumin: 4.5 g/dL (ref 3.5–5.2)
Alkaline Phosphatase: 75 U/L (ref 39–117)
BUN: 18 mg/dL (ref 6–23)
CO2: 26 meq/L (ref 19–32)
Calcium: 9.7 mg/dL (ref 8.4–10.5)
Chloride: 106 meq/L (ref 96–112)
Creatinine, Ser: 0.74 mg/dL (ref 0.40–1.50)
GFR: 125.23 mL/min
Glucose, Bld: 98 mg/dL (ref 70–99)
Potassium: 4.1 meq/L (ref 3.5–5.1)
Sodium: 140 meq/L (ref 135–145)
Total Bilirubin: 0.3 mg/dL (ref 0.2–1.2)
Total Protein: 7.9 g/dL (ref 6.0–8.3)

## 2021-12-28 LAB — CBC
HCT: 47.3 % (ref 39.0–52.0)
Hemoglobin: 15.7 g/dL (ref 13.0–17.0)
MCHC: 33.1 g/dL (ref 30.0–36.0)
MCV: 81.2 fl (ref 78.0–100.0)
Platelets: 291 K/uL (ref 150.0–400.0)
RBC: 5.83 Mil/uL — ABNORMAL HIGH (ref 4.22–5.81)
RDW: 14.1 % (ref 11.5–15.5)
WBC: 7.2 K/uL (ref 4.0–10.5)

## 2022-01-02 LAB — 10-HYDROXYCARBAZEPINE: Triliptal/MTB(Oxcarbazepin): 24.7 ug/mL (ref 8.0–35.0)

## 2022-01-02 LAB — LEVETIRACETAM LEVEL: Keppra (Levetiracetam): 16 ug/mL

## 2022-01-03 ENCOUNTER — Telehealth: Payer: Self-pay

## 2022-01-03 MED ORDER — LEVETIRACETAM 750 MG PO TABS: 750.0000 mg | ORAL_TABLET | Freq: Two times a day (BID) | ORAL | 5 refills | Status: DC

## 2022-01-03 NOTE — Telephone Encounter (Signed)
Telephone call from patient mother, Mail in pharmacy never received new increase Keppra.  Advised Patient mother script was sent to local [facility].  Sent script to [facility] in Pharmacy per patient mother request.

## 2022-05-07 ENCOUNTER — Other Ambulatory Visit: Payer: Self-pay | Admitting: Neurology

## 2022-05-07 DIAGNOSIS — G40309 Generalized idiopathic epilepsy and epileptic syndromes, not intractable, without status epilepticus: Secondary | ICD-10-CM

## 2022-05-07 DIAGNOSIS — G40209 Localization-related (focal) (partial) symptomatic epilepsy and epileptic syndromes with complex partial seizures, not intractable, without status epilepticus: Secondary | ICD-10-CM

## 2022-06-13 ENCOUNTER — Other Ambulatory Visit: Payer: Self-pay

## 2022-06-13 DIAGNOSIS — G40209 Localization-related (focal) (partial) symptomatic epilepsy and epileptic syndromes with complex partial seizures, not intractable, without status epilepticus: Secondary | ICD-10-CM

## 2022-06-13 DIAGNOSIS — G40309 Generalized idiopathic epilepsy and epileptic syndromes, not intractable, without status epilepticus: Secondary | ICD-10-CM

## 2022-06-13 MED ORDER — LEVETIRACETAM 750 MG PO TABS: 750.0000 mg | ORAL_TABLET | Freq: Two times a day (BID) | ORAL | 0 refills | Status: DC

## 2022-06-13 MED ORDER — OXCARBAZEPINE 600 MG PO TABS: ORAL_TABLET | ORAL | 0 refills | Status: DC

## 2022-06-13 NOTE — Telephone Encounter (Signed)
Refill request received from [facility] Oxcarbazepine and Keppra.  Refills sent to last until 07/04/22 visit.

## 2022-06-27 ENCOUNTER — Telehealth: Payer: Self-pay | Admitting: Neurology

## 2022-06-27 ENCOUNTER — Encounter: Payer: Self-pay | Admitting: Neurology

## 2022-06-27 NOTE — Telephone Encounter (Signed)
Pt called in to check the status of her paperwork that is supposed to be filled out.

## 2022-06-27 NOTE — Telephone Encounter (Signed)
Please allow two weeks for any paperwork that needs to be filled.

## 2022-07-01 NOTE — Telephone Encounter (Signed)
Tried calling patient no answer. LMOVM please give the office a call back to discuss paperwork.

## 2022-07-02 NOTE — Progress Notes (Unsigned)
NEUROLOGY FOLLOW UP OFFICE NOTE  Isaiah Knapp [id number]  Assessment/Plan:   Focal epilepsy with impaired consciousness, symptomatic Generalized convulsive epilepsy Mild intellectual disability.     1.  To further optimize seizure control, increase levetiracetam to 750mg  twice daily.  Continue oxcarbazepine 1200mg  twice daily 2.  Will check routine labs - CBC, CMP, and trough oxcarbazepine metabolite level and levetiracetam level.   3. Follow up 6 months.     Subjective:  Isaiah Knapp is a 25 year old right-handed male who follows up for seizure disorder.  He is accompanied by his mother.    UPDATE: Current medications:  oxcarbazepine 1200mg  BID, levetiracetam 750mg  BID  Last visit, titrated levetiracetam up to 750mg  BID.  Seizures now last 5 to 10 seconds and occurring twice a month.  .  12/28/2021 LABS:  CBC with WBC 7.2, HGB 15.7, HCT 47.3, PLT 291; CMP with Na 140, K 4.1, Cl 106, CO2 26, glucose 98, BUN 18, Cr 0.74, t bili 0.3, ALP 75, AST 16, ALT 23; levetiracetam level 16; 10-hydroxycarbazepine level 24.7  HISTORY: Etiology is presumed to be a closed head injury suffered at age 78 years old when he fell off of the bed striking his head on the bed frame.  He had a seizure the next day.  He was hospitalized for 3 months.  He was fine for the next 9 months until he had his second seizure.     Initially, semiology of seizures presenting as generalized tonic-clonic with loss of consciousness and convulsions for 5 minutes followed by postictal fatigue for 1 to 2 days.  Since being on medication, they present without loss of consciousness but he appears altered with staring, moaning and repeating phrases. No falls.  Lasts 1 to 2 minutes without postictal state.     On average, seizures occur every 2 to 3 months.  At its most controlled, they average every 6 months.  Most recent change in medication management occurred in January 2022, in which oxcarbazepine was increased and he was  tapered off of levetiracetam.  Since then, he has had about 4 or 5 seizures.  Last seizure was 2 months ago.  Reports no side effects to oxcarbazepine or levetiracetam.    Current anticonvulsant therapy:  oxcarbazepine 1200mg  twice daily Past anticonvulsant therapy:  Levetiracetam, carbamazepine   He was born premature at 28 weeks, requiring stay in the NICU for 3 months.  He has diagnosed with mild intellectual disability.  He does not drive.  He works in at .  He does not drive.  No family history of seizures.     Testing: 08/15/2020 CT HEAD (personally reviewed):  No evidence of acute intracranial abnormality. 03/22/2019 EEG:  right frontotemporal diphasic sharply contoured slow wave activity.  Background activity was otherwise normal with the patient awake, drowsy, and asleep. 03/09/2019 CT HEAD (personally reviewed):  normal brain with a caclification along the right tentorial falx 1.3 x 0.8 cm 09/01/2017 EEG:  normal record awake, drowsy and asleep.   07/31/2011 EEG:  normal record with the patient awake. 02/10/2003 EEG:  frequent independent and synchronous frontal polar spike and spike and wave discharges at times occurring on multiple occasions throughout each 10 second epoch Predominance shifted between hemispheres. At times they spread over the paracentral regions posteriorly right greater than left. Background activity appeared otherwise normal. There were no photoparoxysmal response was. Seizure activity was more prominent when the patient was asleep than awake.    PAST  MEDICAL HISTORY: Past Medical History:  Diagnosis Date   Hypertension    Seizure (HCC) 2001   from head trauma    MEDICATIONS: Current Outpatient Medications on File Prior to Visit  Medication Sig Dispense Refill   levETIRAcetam (KEPPRA) 750 MG tablet Take 1 tablet (750 mg total) by mouth 2 (two) times daily. 60 tablet 0   Multiple Vitamins-Minerals (HM MULTIVITAMIN ADULT GUMMY  PO) Take 1 each by mouth every morning.     oxcarbazepine (TRILEPTAL) 600 MG tablet TAKE 2 TABLETS BY MOUTH EVERY MORNING AND TAKE TWO TABLETS BY MOUTH AT BEDTIME 120 tablet 0   No current facility-administered medications on file prior to visit.    ALLERGIES: No Known Allergies  FAMILY HISTORY: Family History  Problem Relation Age of Onset   Hypertension Mother    Hypothyroidism Mother    Heart attack Maternal Grandfather        Died at the age of 24   Breast cancer Paternal Grandmother    Heart attack Paternal Grandfather       Objective:  *** General: No acute distress.  Patient appears well-groomed.   Head:  Normocephalic/atraumatic Eyes:  Fundi examined but not visualized Neck: supple, no paraspinal tenderness, full range of motion Heart:  Regular rate and rhythm Neurological Exam: ***   Shon Millet, DO  CC: Jarrett Soho, PA-C

## 2022-07-04 ENCOUNTER — Encounter: Payer: Self-pay | Admitting: Neurology

## 2022-07-04 ENCOUNTER — Ambulatory Visit: Payer: 59 | Admitting: Neurology

## 2022-07-04 DIAGNOSIS — G40309 Generalized idiopathic epilepsy and epileptic syndromes, not intractable, without status epilepticus: Secondary | ICD-10-CM

## 2022-07-04 DIAGNOSIS — G40209 Localization-related (focal) (partial) symptomatic epilepsy and epileptic syndromes with complex partial seizures, not intractable, without status epilepticus: Secondary | ICD-10-CM

## 2022-07-04 MED ORDER — OXCARBAZEPINE 600 MG PO TABS: ORAL_TABLET | ORAL | 1 refills | Status: DC

## 2022-07-04 MED ORDER — LEVETIRACETAM 1000 MG PO TABS: 1000.0000 mg | ORAL_TABLET | Freq: Two times a day (BID) | ORAL | 1 refills | Status: DC

## 2022-07-04 NOTE — Patient Instructions (Addendum)
Increase levetiracetam to 1000mg  twice daily Continue oxcarbazepine 1200mg  twice daily Follow up in 6 months.

## 2022-09-24 IMAGING — CT CT HEAD W/O CM
4 series · 17 of 47 positions shown, 19 images · non-contrast
Comparison: CT head 03/09/2019.

CLINICAL DATA: Seizure.

EXAM:
CT HEAD WITHOUT CONTRAST
TECHNIQUE: Contiguous axial images were obtained from the base of the skull
through the vertex without intravenous contrast.

[Series 3: head without · axial · non-contrast · 0.46mm/px · z∈[-109,+21]mm · 7 of 36 slices shown, 9 images]
[im 5/36  brain]
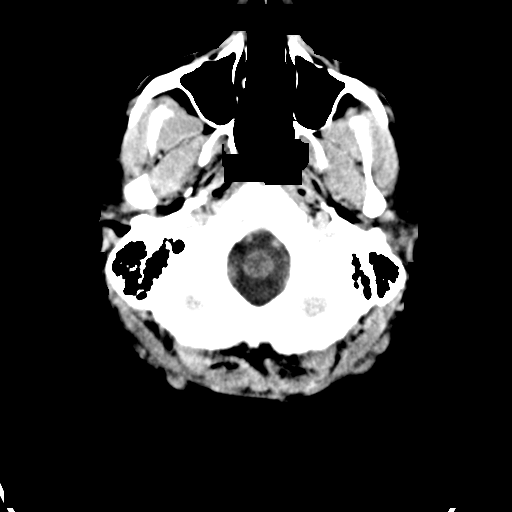
[im 5/36  bone]
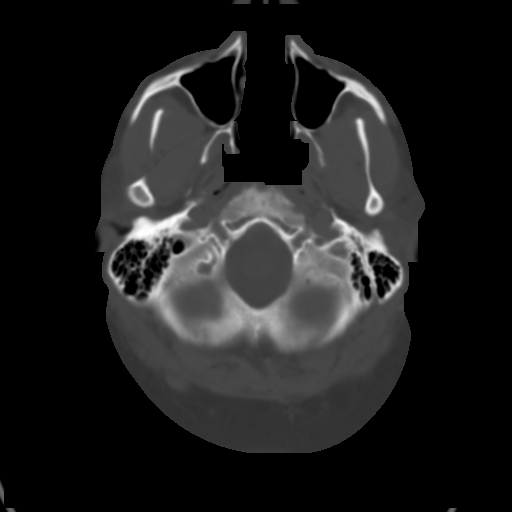
[im 9/36  brain]
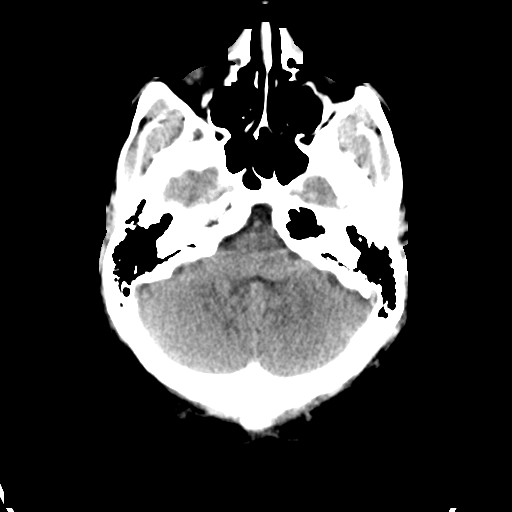
[im 14/36  brain]
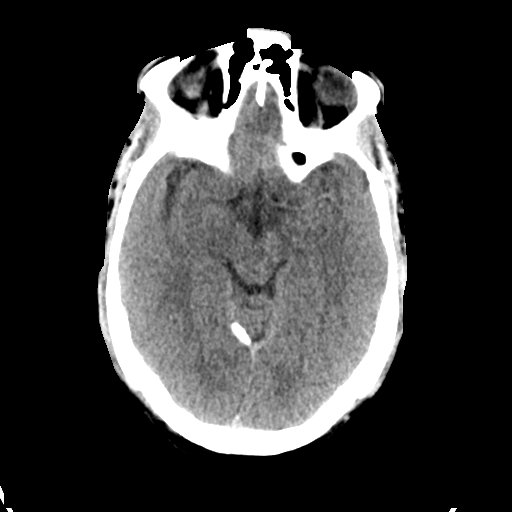
[im 18/36  brain]
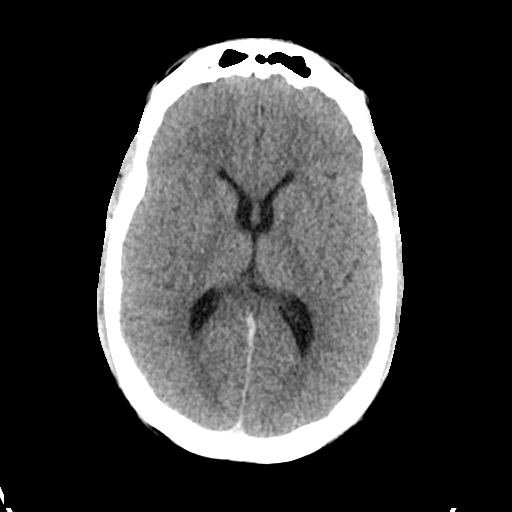
[im 22/36  brain]
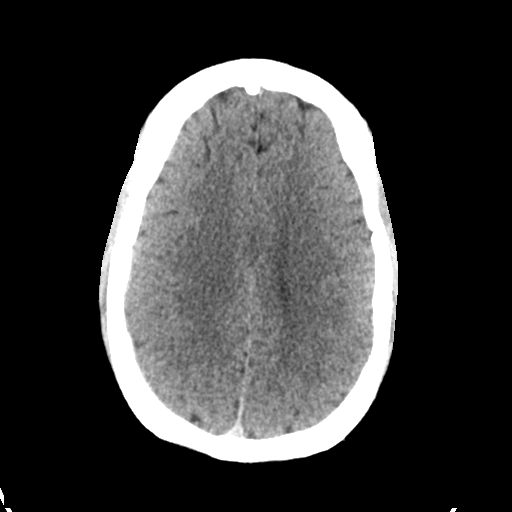
[im 22/36  bone]
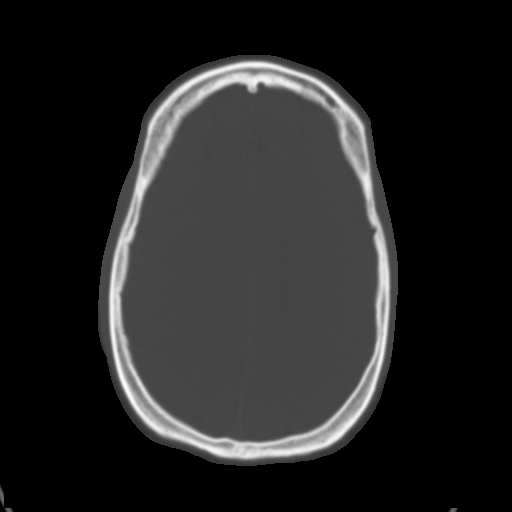
[im 27/36  brain]
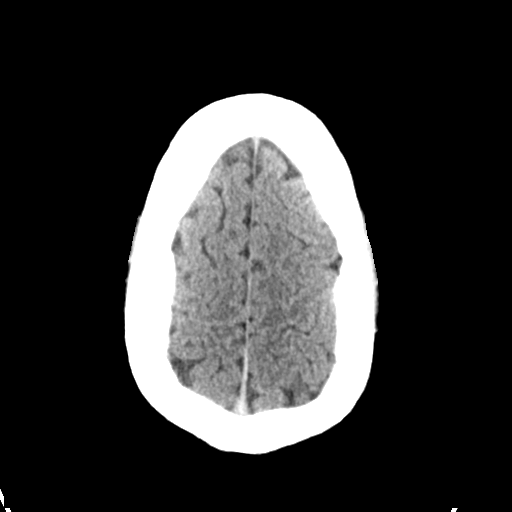
[im 31/36  brain]
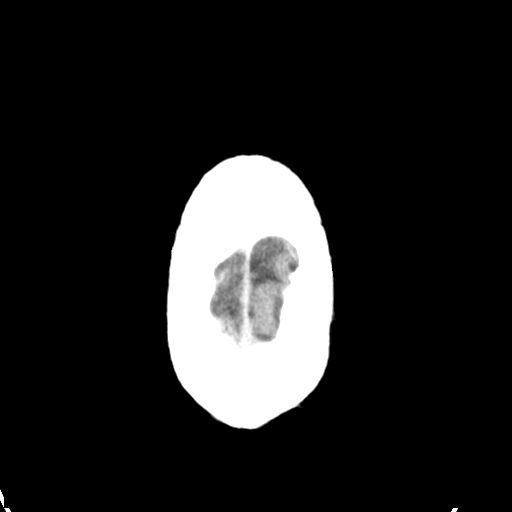

[Series 4: head bone · axial · 0.46mm/px · z∈[-113,-51]mm · 4 of 89 slices shown]
[im 9/89  bone]
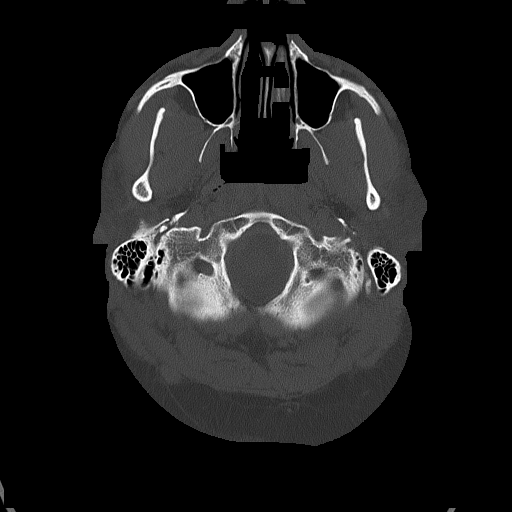
[im 18/89  bone]
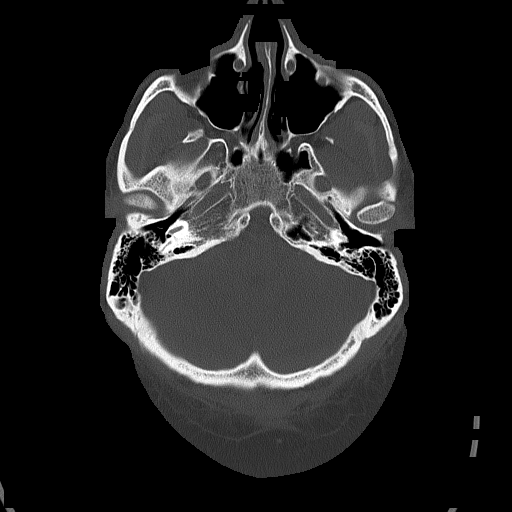
[im 27/89  bone]
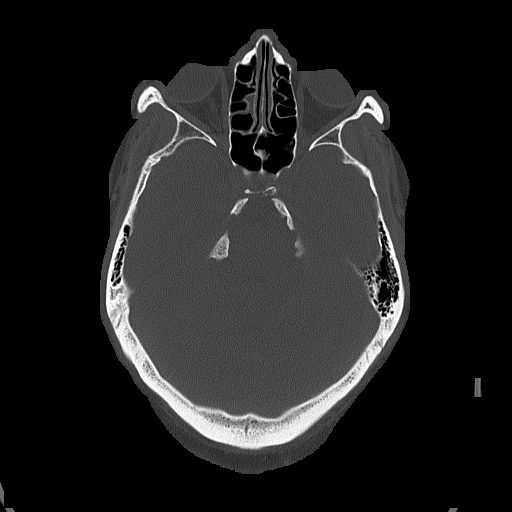
[im 40/89  bone]
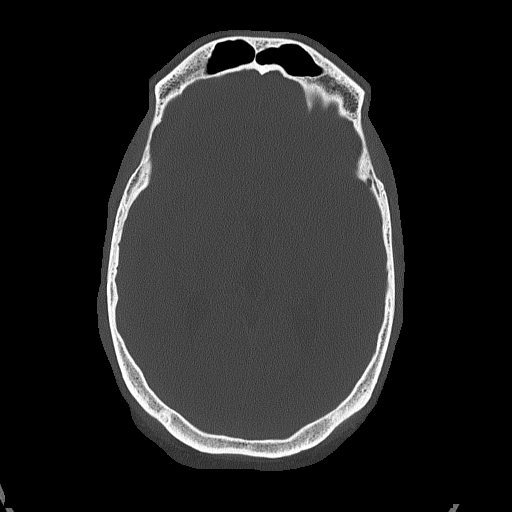

[Series 5: head without cor · coronal · non-contrast · 0.35mm/px · 3 of 72 slices shown]
[im 24/72  brain]
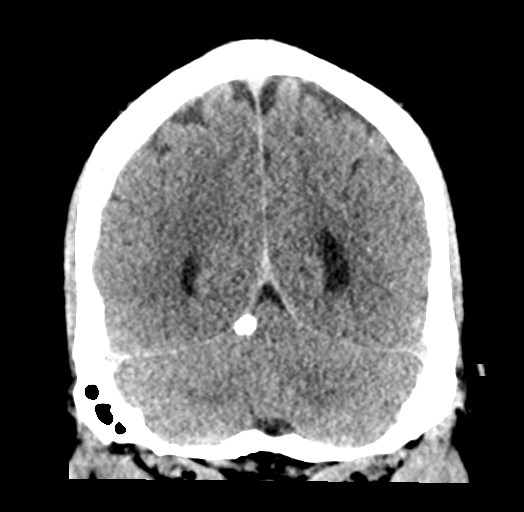
[im 32/72  brain]
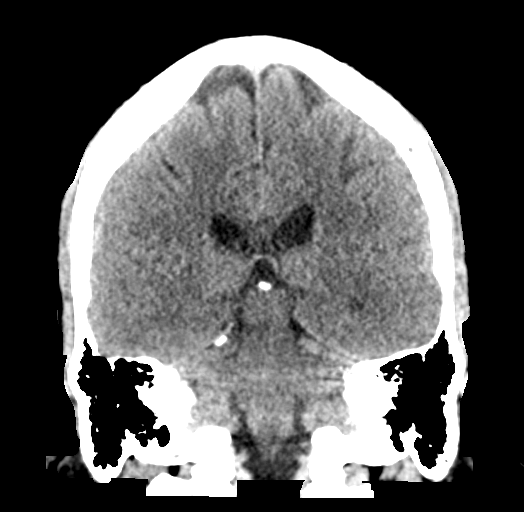
[im 40/72  brain]
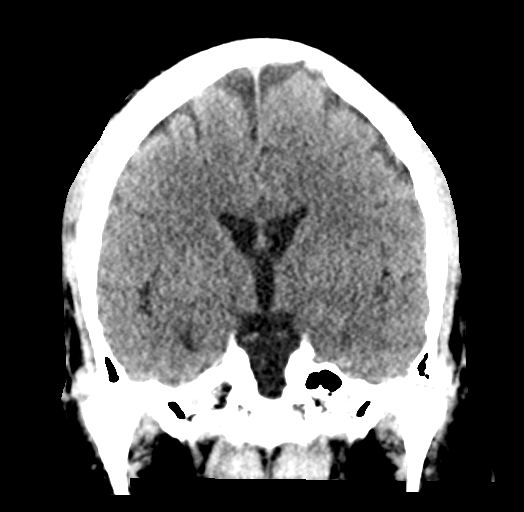

[Series 6: head without sag · sagittal · non-contrast · 0.35mm/px · 3 of 57 slices shown]
[im 19/57  brain]
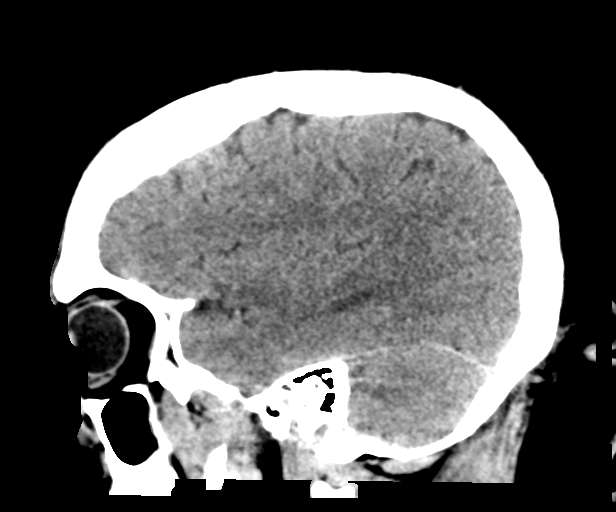
[im 29/57  brain]
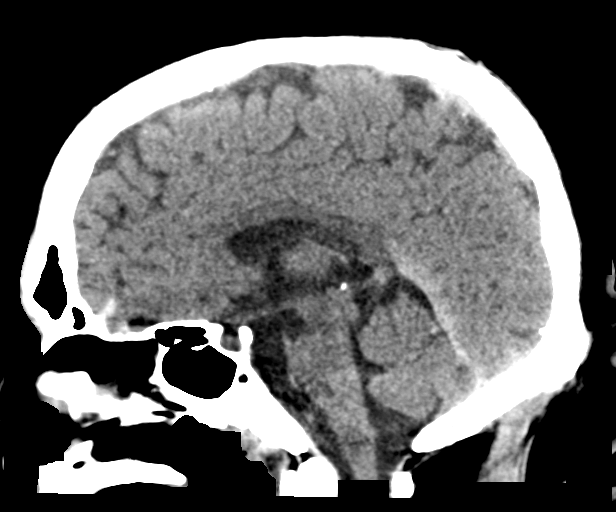
[im 38/57  brain]
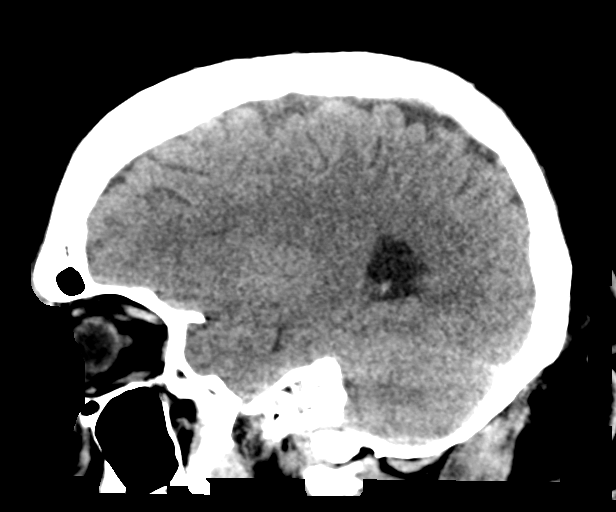

[17 of 47 positions shown; findings below may reference images not displayed]

FINDINGS: Brain: No evidence of acute infarction, hemorrhage, hydrocephalus,
extra-axial collection or mass lesion/mass effect. Similar
ossification along the right tentorium.

Vascular: No hyperdense vessel identified.

Skull: No acute fracture.

Sinuses/Orbits: Mild ethmoid air cell mucosal thickening without
air-fluid levels. Unremarkable orbits.

Other: No mastoid effusions.
IMPRESSION: No evidence of acute intracranial abnormality. If the patient's
seizures continue, epilepsy protocol MRI could provide more
sensitive evaluation for an anatomic epileptogenic abnormality.

## 2022-11-25 ENCOUNTER — Other Ambulatory Visit: Payer: Self-pay | Admitting: Neurology

## 2022-11-25 NOTE — Telephone Encounter (Signed)
Patient to get further refills at his follow up visit 12/2022

## 2022-11-27 ENCOUNTER — Other Ambulatory Visit: Payer: Self-pay | Admitting: Neurology

## 2022-11-27 DIAGNOSIS — G40209 Localization-related (focal) (partial) symptomatic epilepsy and epileptic syndromes with complex partial seizures, not intractable, without status epilepticus: Secondary | ICD-10-CM

## 2022-11-27 DIAGNOSIS — G40309 Generalized idiopathic epilepsy and epileptic syndromes, not intractable, without status epilepticus: Secondary | ICD-10-CM

## 2023-01-01 NOTE — Progress Notes (Signed)
NEUROLOGY FOLLOW UP OFFICE NOTE  Isaiah Knapp [id number]  Assessment/Plan:   Focal epilepsy with impaired consciousness, symptomatic  Generalized convulsive epilepsy Mild intellectual disability. Overall improved.     1.  To optimize seizure control increase levetiracetam to 750mg  twice daily; continue oxcarbazepine 1200mg  twice daily 2.  Check CBC and CMP 3.  Follow up 6 months.     Subjective:  Isaiah Knapp is a 27 year old right-handed male who follows up for seizure disorder.  He is accompanied by his mother.    UPDATE: Current medications:  oxcarbazepine 1200mg  BID, levetiracetam 1000mg  BID  Last visit, increased levetiracetam to 1000mg  BID.  No seizures until a few days ago when he had a small seizure daily for 3 days.  He may have had a cold.     HISTORY: Etiology is presumed to be a closed head injury suffered at age 69 years old when he fell off of the bed striking his head on the bed frame.  He had a seizure the next day.  He was hospitalized for 3 months.  He was fine for the next 9 months until he had his second seizure.     Initially, semiology of seizures presenting as generalized tonic-clonic with loss of consciousness and convulsions for 5 minutes followed by postictal fatigue for 1 to 2 days.  Since being on medication, they present without loss of consciousness but he appears altered with staring, moaning and repeating phrases. No falls.  Lasts 1 to 2 minutes without postictal state.     On average, seizures occur every 2 to 3 months.  At its most controlled, they average every 6 months.  Most recent change in medication management occurred in January 2022, in which oxcarbazepine was increased and he was tapered off of levetiracetam.  Since then, he has had about 4 or 5 seizures.  Last seizure was 2 months ago.  Reports no side effects to oxcarbazepine or levetiracetam.    Current anticonvulsant therapy:  oxcarbazepine 1200mg  twice daily Past anticonvulsant  therapy:  carbamazepine   He was born premature at 28 weeks, requiring stay in the NICU for 3 months.  He has diagnosed with mild intellectual disability.  He does not drive.  He works in [employer] at [employer].  He does not drive.  No family history of seizures.     Testing: 08/15/2020 CT HEAD (personally reviewed):  No evidence of acute intracranial abnormality. 03/22/2019 EEG:  right frontotemporal diphasic sharply contoured slow wave activity.  Background activity was otherwise normal with the patient awake, drowsy, and asleep. 03/09/2019 CT HEAD (personally reviewed):  normal brain with a caclification along the right tentorial falx 1.3 x 0.8 cm 09/01/2017 EEG:  normal record awake, drowsy and asleep.   07/31/2011 EEG:  normal record with the patient awake. 02/10/2003 EEG:  frequent independent and synchronous frontal polar spike and spike and wave discharges at times occurring on multiple occasions throughout each 10 second epoch Predominance shifted between hemispheres. At times they spread over the paracentral regions posteriorly right greater than left. Background activity appeared otherwise normal. There were no photoparoxysmal response was. Seizure activity was more prominent when the patient was asleep than awake.    PAST MEDICAL HISTORY: Past Medical History:  Diagnosis Date   Hypertension    Seizure (HCC) 2001   from head trauma    MEDICATIONS: Current Outpatient Medications on File Prior to Visit  Medication Sig Dispense Refill   levETIRAcetam (KEPPRA) 750 MG  tablet Take 1 tablet (750 mg total) by mouth 2 (two) times daily. 60 tablet 0   Multiple Vitamins-Minerals (HM MULTIVITAMIN ADULT GUMMY PO) Take 1 each by mouth every morning.     oxcarbazepine (TRILEPTAL) 600 MG tablet TAKE 2 TABLETS BY MOUTH EVERY MORNING AND TAKE TWO TABLETS BY MOUTH AT BEDTIME 120 tablet 0   No current facility-administered medications on file prior to visit.    ALLERGIES: No  Known Allergies  FAMILY HISTORY: Family History  Problem Relation Age of Onset   Hypertension Mother    Hypothyroidism Mother    Heart attack Maternal Grandfather        Died at the age of 67   Breast cancer Paternal Grandmother    Heart attack Paternal Grandfather       Objective:  Blood pressure 130/70, pulse 82, height 5\' 10"  (1.778 m), weight 268 lb (121.6 kg), SpO2 98 %. General: No acute distress.  Patient appears well-groomed.   Head:  Normocephalic/atraumatic Eyes:  Fundi examined but not visualized Neck: supple, no paraspinal tenderness, full range of motion Heart:  Regular rate and rhythm Neurological Exam: alert and oriented.  Speech fluent and not dysarthric, language intact.  CN II-XII intact. Bulk and tone normal, muscle strength 5/5 throughout.  Sensation to light touch intact.  Deep tendon reflexes 2+ throughout.  Finger to nose testing intact.  Gait normal, Romberg negative.   Shon Millet, DO

## 2023-01-03 ENCOUNTER — Other Ambulatory Visit (INDEPENDENT_AMBULATORY_CARE_PROVIDER_SITE_OTHER): Payer: 59

## 2023-01-03 ENCOUNTER — Ambulatory Visit: Payer: 59 | Admitting: Neurology

## 2023-01-03 ENCOUNTER — Encounter: Payer: Self-pay | Admitting: Neurology

## 2023-01-03 VITALS — BP 130/70 | HR 82 | Ht 70.0 in | Wt 268.0 lb

## 2023-01-03 DIAGNOSIS — G40209 Localization-related (focal) (partial) symptomatic epilepsy and epileptic syndromes with complex partial seizures, not intractable, without status epilepticus: Secondary | ICD-10-CM | POA: Diagnosis not present

## 2023-01-03 DIAGNOSIS — G40309 Generalized idiopathic epilepsy and epileptic syndromes, not intractable, without status epilepticus: Secondary | ICD-10-CM | POA: Diagnosis not present

## 2023-01-03 DIAGNOSIS — Z79899 Other long term (current) drug therapy: Secondary | ICD-10-CM

## 2023-01-03 LAB — CBC
HCT: 48.4 % (ref 39.0–52.0)
Hemoglobin: 15.8 g/dL (ref 13.0–17.0)
MCHC: 32.7 g/dL (ref 30.0–36.0)
MCV: 81.7 fl (ref 78.0–100.0)
Platelets: 313 10*3/uL (ref 150.0–400.0)
RBC: 5.92 Mil/uL — ABNORMAL HIGH (ref 4.22–5.81)
RDW: 13.7 % (ref 11.5–15.5)
WBC: 8.7 10*3/uL (ref 4.0–10.5)

## 2023-01-03 LAB — COMPREHENSIVE METABOLIC PANEL
ALT: 23 U/L (ref 0–53)
AST: 19 U/L (ref 0–37)
Albumin: 4.4 g/dL (ref 3.5–5.2)
Alkaline Phosphatase: 74 U/L (ref 39–117)
BUN: 21 mg/dL (ref 6–23)
CO2: 29 mEq/L (ref 19–32)
Calcium: 9.7 mg/dL (ref 8.4–10.5)
Chloride: 101 mEq/L (ref 96–112)
Creatinine, Ser: 0.84 mg/dL (ref 0.40–1.50)
GFR: 119.67 mL/min (ref >60.00)
Glucose, Bld: 82 mg/dL (ref 70–99)
Potassium: 4.3 mEq/L (ref 3.5–5.1)
Sodium: 138 mEq/L (ref 135–145)
Total Bilirubin: 0.2 mg/dL (ref 0.2–1.2)
Total Protein: 7.8 g/dL (ref 6.0–8.3)

## 2023-01-03 MED ORDER — OXCARBAZEPINE 600 MG PO TABS: ORAL_TABLET | ORAL | 3 refills | Status: DC

## 2023-01-03 MED ORDER — LEVETIRACETAM 750 MG PO TABS: 1500.0000 mg | ORAL_TABLET | Freq: Two times a day (BID) | ORAL | 3 refills | Status: DC

## 2023-01-03 NOTE — Patient Instructions (Addendum)
Help finding a primary care provider within [place]. If you do not have access to a computer or the Internet you can call [phone] and  they will help you scheduled with a provider.  Or you can go to: [email or web address]    Increase levetiracetam to 1500mg  twice daily (two 750mg  pills twice daily) Continue oxcarbazepine 1200mg  twice daily Check CBC and CMP Follow up 6 months.

## 2023-02-25 ENCOUNTER — Other Ambulatory Visit: Payer: Self-pay | Admitting: Neurology

## 2023-02-25 DIAGNOSIS — G40209 Localization-related (focal) (partial) symptomatic epilepsy and epileptic syndromes with complex partial seizures, not intractable, without status epilepticus: Secondary | ICD-10-CM

## 2023-02-25 DIAGNOSIS — G40309 Generalized idiopathic epilepsy and epileptic syndromes, not intractable, without status epilepticus: Secondary | ICD-10-CM

## 2023-03-07 ENCOUNTER — Other Ambulatory Visit: Payer: Self-pay | Admitting: Neurology

## 2023-03-07 DIAGNOSIS — G40309 Generalized idiopathic epilepsy and epileptic syndromes, not intractable, without status epilepticus: Secondary | ICD-10-CM

## 2023-03-07 DIAGNOSIS — G40209 Localization-related (focal) (partial) symptomatic epilepsy and epileptic syndromes with complex partial seizures, not intractable, without status epilepticus: Secondary | ICD-10-CM

## 2023-03-21 ENCOUNTER — Other Ambulatory Visit: Payer: Self-pay | Admitting: Neurology

## 2023-03-21 DIAGNOSIS — G40209 Localization-related (focal) (partial) symptomatic epilepsy and epileptic syndromes with complex partial seizures, not intractable, without status epilepticus: Secondary | ICD-10-CM

## 2023-03-21 DIAGNOSIS — G40309 Generalized idiopathic epilepsy and epileptic syndromes, not intractable, without status epilepticus: Secondary | ICD-10-CM

## 2023-07-11 NOTE — Progress Notes (Unsigned)
NEUROLOGY FOLLOW UP OFFICE NOTE  [name] [id number]  Assessment/Plan:   Focal epilepsy with impaired consciousness, symptomatic  Generalized convulsive epilepsy Mild intellectual disability.      1.  Levetiracetam 1500mg  twice daily, oxcarbazepine 1200mg  twice daily 2.  Follow up 6 months.     Subjective:  Isaiah Knapp is a 27 year old right-handed male who follows up for seizure disorder.  He is accompanied by his mother.    UPDATE: Current medications:  oxcarbazepine 1200mg  BID, levetiracetam 1500mg  BID  Last visit, increased levetiracetam to 1500mg  BID.  ***  01/03/2023 LABS:  CBC with WBC 8.7, HGB 15.8, HCT 48.4, PLT 313; CMP with Na 138, K 4.3, Cl 101, CO2 29, glucose 82, BUN 21, Cr 0.84, t bili 0.2, ALP 74, AST 19, ALT 23.   HISTORY: Etiology is presumed to be a closed head injury suffered at age 84 years old when he fell off of the bed striking his head on the bed frame.  He had a seizure the next day.  He was hospitalized for 3 months.  He was fine for the next 9 months until he had his second seizure.     Initially, semiology of seizures presenting as generalized tonic-clonic with loss of consciousness and convulsions for 5 minutes followed by postictal fatigue for 1 to 2 days.  Since being on medication, they present without loss of consciousness but he appears altered with staring, moaning and repeating phrases. No falls.  Lasts 1 to 2 minutes without postictal state.     On average, seizures occur every 2 to 3 months.  At its most controlled, they average every 6 months.  Most recent change in medication management occurred in January 2022, in which oxcarbazepine was increased and he was tapered off of levetiracetam.  Since then, he has had about 4 or 5 seizures.  Last seizure was 2 months ago.  Reports no side effects to oxcarbazepine or levetiracetam.    Current anticonvulsant therapy:  oxcarbazepine 1200mg  twice daily Past anticonvulsant therapy:   carbamazepine   He was born premature at 28 weeks, requiring stay in the NICU for 3 months.  He has diagnosed with mild intellectual disability.  He does not drive.  He works in [employer] at [employer].  He does not drive.  No family history of seizures.     Testing: 08/15/2020 CT HEAD (personally reviewed):  No evidence of acute intracranial abnormality. 03/22/2019 EEG:  right frontotemporal diphasic sharply contoured slow wave activity.  Background activity was otherwise normal with the patient awake, drowsy, and asleep. 03/09/2019 CT HEAD (personally reviewed):  normal brain with a caclification along the right tentorial falx 1.3 x 0.8 cm 09/01/2017 EEG:  normal record awake, drowsy and asleep.   07/31/2011 EEG:  normal record with the patient awake. 02/10/2003 EEG:  frequent independent and synchronous frontal polar spike and spike and wave discharges at times occurring on multiple occasions throughout each 10 second epoch Predominance shifted between hemispheres. At times they spread over the paracentral regions posteriorly right greater than left. Background activity appeared otherwise normal. There were no photoparoxysmal response was. Seizure activity was more prominent when the patient was asleep than awake.    PAST MEDICAL HISTORY: Past Medical History:  Diagnosis Date   Hypertension    Seizure (HCC) 2001   from head trauma    MEDICATIONS: Current Outpatient Medications on File Prior to Visit  Medication Sig Dispense Refill   levETIRAcetam (KEPPRA) 750 MG  tablet Take 2 tablets (1,500 mg total) by mouth 2 (two) times daily. 360 tablet 3   Multiple Vitamins-Minerals (HM MULTIVITAMIN ADULT GUMMY PO) Take 1 each by mouth every morning.     oxcarbazepine (TRILEPTAL) 600 MG tablet TAKE 2 TABLETS BY MOUTH EVERY  MORNING AND TAKE 2 TABLETS BY  MOUTH AT BEDTIME 360 tablet 3   No current facility-administered medications on file prior to visit.    ALLERGIES: No Known  Allergies  FAMILY HISTORY: Family History  Problem Relation Age of Onset   Hypertension Mother    Hypothyroidism Mother    Heart attack Maternal Grandfather        Died at the age of 73   Breast cancer Paternal Grandmother    Heart attack Paternal Grandfather       Objective:  *** General: No acute distress.  Patient appears well-groomed.   Head:  Normocephalic/atraumatic Eyes:  Fundi examined but not visualized Neck: supple, no paraspinal tenderness, full range of motion Heart:  Regular rate and rhythm Neurological Exam: ***   Shon Millet, DO

## 2023-07-14 ENCOUNTER — Ambulatory Visit: Payer: 59 | Admitting: Neurology

## 2023-07-14 ENCOUNTER — Encounter: Payer: Self-pay | Admitting: Neurology

## 2023-07-14 VITALS — BP 140/81 | HR 82 | Ht 72.0 in | Wt 255.0 lb

## 2023-07-14 DIAGNOSIS — G40209 Localization-related (focal) (partial) symptomatic epilepsy and epileptic syndromes with complex partial seizures, not intractable, without status epilepticus: Secondary | ICD-10-CM | POA: Diagnosis not present

## 2023-07-14 NOTE — Patient Instructions (Addendum)
Help finding a primary care provider within [place]. If you do not have access to a computer or the Internet you can call [phone] and  they will help you scheduled with a provider.  Or you can go to: [email or web address]     1. Continue oxcarbazepine 1200mg  twice daily and leviteracetam 1500mg  twice daily.  Take it exactly as directed.  Do not stop taking the medicine without talking to your doctor first, even if you have not had a seizure in a long time.   2. Avoid activities in which a seizure would cause danger to yourself or to others.  Don't operate dangerous machinery, swim alone, or climb in high or dangerous places, such as on ladders, roofs, or girders.  Do not drive unless your doctor says you may.  3. If you have any warning that you may have a seizure, lay down in a safe place where you can't hurt yourself.    4.  No driving for 6 months from last seizure, as per [facility].   Please refer to the following link on the [employer]'s website for more information: http://www.epilepsyfoundation.org/answerplace/Social/driving/drivingu.cfm   5.  Maintain good sleep hygiene.  6.  Notify your neurology if you are planning pregnancy or if you become pregnant.  7.  Contact your doctor if you have any problems that may be related to the medicine you are taking.  8.  Call 911 and bring the patient back to the ED if:        A.  The seizure lasts longer than 5 minutes.       B.  The patient doesn't awaken shortly after the seizure  C.  The patient has new problems such as difficulty seeing, speaking or moving  D.  The patient was injured during the seizure  E.  The patient has a temperature over 102 F (39C)  F.  The patient vomited and now is having trouble breathing

## 2023-11-25 ENCOUNTER — Other Ambulatory Visit: Payer: Self-pay | Admitting: Neurology

## 2023-12-14 ENCOUNTER — Other Ambulatory Visit: Payer: Self-pay | Admitting: Neurology

## 2024-01-09 NOTE — Progress Notes (Unsigned)
 NEUROLOGY FOLLOW UP OFFICE NOTE  Isaiah Knapp [id number]  Assessment/Plan:   Focal epilepsy with impaired consciousness, symptomatic Mild intellectual disability.      Levetiracetam  1500mg  twice daily, oxcarbazepine  1200mg  twice daily Check CBC and CMP Follow up 6 months.     Subjective:  Isaiah Knapp is a 28 year old right-handed male who follows up for seizure disorder.  He is accompanied by his mother.    UPDATE: Current medications:  oxcarbazepine  1200mg  BID, levetiracetam  1500mg  BID  One or two small seizures (staring for a few seconds) in the past 6 months.  Otherwise doing well.  Tolerating the medications.    HISTORY: Etiology is presumed to be a closed head injury suffered at age 41 years old when he fell off of the bed striking his head on the bed frame.  He had a seizure the next day.  He was hospitalized for 3 months.  He was fine for the next 9 months until he had his second seizure.     Initially, semiology of seizures presenting as generalized tonic-clonic with loss of consciousness and convulsions for 5 minutes followed by postictal fatigue for 1 to 2 days.  Since being on medication, they present without loss of consciousness but he appears altered with staring, moaning and repeating phrases and opening and closing his right hand. No falls.  Lasts 1 to 2 minutes without postictal state.     On average, seizures occur every 2 to 3 months.  At its most controlled, they average every 6 months.  Most recent change in medication management occurred in January 2022, in which oxcarbazepine  was increased and he was tapered off of levetiracetam .  Since then, he has had about 4 or 5 seizures.  Last seizure was 2 months ago.  Reports no side effects to oxcarbazepine  or levetiracetam .     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  He has diagnosed with mild intellectual disability.  He does not  drive.  He works in [employer] at [employer].  He does not drive.  No family history of seizures.     Testing: 08/15/2020 CT HEAD (personally reviewed):  No evidence of acute intracranial abnormality. 03/22/2019 EEG:  right frontotemporal diphasic sharply contoured slow wave activity.  Background activity was otherwise normal with the patient awake, drowsy, and asleep. 03/09/2019 CT HEAD (personally reviewed):  normal brain with a caclification along the right tentorial falx 1.3 x 0.8 cm 09/01/2017 EEG:  normal record awake, drowsy and asleep.   07/31/2011 EEG:  normal record with the patient awake. 02/10/2003 EEG:  frequent independent and synchronous frontal polar spike and spike and wave discharges at times occurring on multiple occasions throughout each 10 second epoch Predominance shifted between hemispheres. At times they spread over the paracentral regions posteriorly right greater than left. Background activity appeared otherwise normal. There were no photoparoxysmal response was. Seizure activity was more prominent when the patient was asleep than awake.    PAST MEDICAL HISTORY: Past Medical History:  Diagnosis Date   Hypertension    Seizure (HCC) 2001   from head trauma    MEDICATIONS: Current Outpatient Medications on File Prior to Visit  Medication Sig Dispense Refill   levETIRAcetam  (KEPPRA ) 750 MG tablet TAKE 2 TABLETS BY MOUTH TWICE  DAILY 120 tablet 0   Multiple Vitamins-Minerals (HM MULTIVITAMIN ADULT GUMMY PO) Take 1 each by mouth every morning.  oxcarbazepine  (TRILEPTAL ) 600 MG tablet TAKE 2 TABLETS BY MOUTH EVERY  MORNING AND TAKE 2 TABLETS BY  MOUTH AT BEDTIME 360 tablet 3   No current facility-administered medications on file prior to visit.    ALLERGIES: No Known Allergies  FAMILY HISTORY: Family History  Problem Relation Age of Onset   Hypertension Mother    Hypothyroidism Mother    Heart attack Maternal Grandfather        Died at the age  of 34   Breast cancer Paternal Grandmother    Heart attack Paternal Grandfather       Objective:  Blood pressure 117/81, pulse 89, height 5' 9 (1.753 m), weight 252 lb (114.3 kg), SpO2 98%. General: No acute distress.  Patient appears well-groomed.   Head:  Normocephalic/atraumatic Neck:  Supple.  No paraspinal tenderness.  Full range of motion. Heart:  Regular rate and rhythm. Neuro:  Alert and oriented.  Speech fluent and not dysarthric.  Language intact.  CN II-XII intact.  Bulk and tone normal.  Muscle strength 5/5 throughout.  Sensation to light touch intact.  Deep tendon reflexes 2+ throughout.  Gait normal.  Romberg negative.     Janne Members, DO

## 2024-01-12 ENCOUNTER — Ambulatory Visit: Payer: 59 | Admitting: Neurology

## 2024-01-12 ENCOUNTER — Other Ambulatory Visit

## 2024-01-12 ENCOUNTER — Encounter: Payer: Self-pay | Admitting: Neurology

## 2024-01-12 VITALS — BP 117/81 | HR 89 | Ht 69.0 in | Wt 252.0 lb

## 2024-01-12 DIAGNOSIS — G40309 Generalized idiopathic epilepsy and epileptic syndromes, not intractable, without status epilepticus: Secondary | ICD-10-CM | POA: Diagnosis not present

## 2024-01-12 DIAGNOSIS — Z79899 Other long term (current) drug therapy: Secondary | ICD-10-CM | POA: Diagnosis not present

## 2024-01-12 DIAGNOSIS — G40209 Localization-related (focal) (partial) symptomatic epilepsy and epileptic syndromes with complex partial seizures, not intractable, without status epilepticus: Secondary | ICD-10-CM

## 2024-01-12 MED ORDER — OXCARBAZEPINE 600 MG PO TABS: ORAL_TABLET | ORAL | 3 refills | Status: AC

## 2024-01-12 MED ORDER — LEVETIRACETAM 750 MG PO TABS: 1500.0000 mg | ORAL_TABLET | Freq: Two times a day (BID) | ORAL | 3 refills | Status: AC

## 2024-01-12 NOTE — Patient Instructions (Addendum)
 Help finding a primary care provider within [place]. If you do not have access to a computer or the Internet you can call [phone] and  they will help you scheduled with a provider.  Or you can go to: [email or web address]    Continue oxcarbazepine  1200mg  twice daily and levetiracetam  1500mg  twice daily Check CBC and CMP today Follow up 6 months  1. If medication has been prescribed for you to prevent seizures, take it exactly as directed.  Do not stop taking the medicine without talking to your doctor first, even if you have not had a seizure in a long time.   2. Avoid activities in which a seizure would cause danger to yourself or to others.  Don't operate dangerous machinery, swim alone, or climb in high or dangerous places, such as on ladders, roofs, or girders.  Do not drive unless your doctor says you may.  3. If you have any warning that you may have a seizure, lay down in a safe place where you can't hurt yourself.    4.  No driving for 6 months from last seizure, as per [place]  state law.   Please refer to the following link on the [employer]'s website for more information: http://www.epilepsyfoundation.org/answerplace/Social/driving/drivingu.cfm   5.  Maintain good sleep hygiene.  6.  Notify your neurology if you are planning pregnancy or if you become pregnant.  7.  Contact your doctor if you have any problems that may be related to the medicine you are taking.  8.  Call 911 and bring the patient back to the ED if:        A.  The seizure lasts longer than 5 minutes.       B.  The patient doesn't awaken shortly after the seizure  C.  The patient has new problems such as difficulty seeing, speaking or moving  D.  The patient was injured during the seizure  E.  The patient has a temperature over 102 F (39C)  F.  The patient vomited and now is having trouble breathing

## 2024-01-13 ENCOUNTER — Ambulatory Visit: Payer: Self-pay | Admitting: Neurology

## 2024-01-13 LAB — COMPREHENSIVE METABOLIC PANEL WITH GFR
AG Ratio: 1.7 (calc) (ref 1.0–2.5)
ALT: 22 U/L (ref 9–46)
AST: 13 U/L (ref 10–40)
Albumin: 4.8 g/dL (ref 3.6–5.1)
Alkaline phosphatase (APISO): 79 U/L (ref 36–130)
BUN: 17 mg/dL (ref 7–25)
CO2: 28 mmol/L (ref 20–32)
Calcium: 9.7 mg/dL (ref 8.6–10.3)
Chloride: 102 mmol/L (ref 98–110)
Creat: 0.74 mg/dL (ref 0.60–1.24)
Globulin: 2.8 g/dL (ref 1.9–3.7)
Glucose, Bld: 80 mg/dL (ref 65–139)
Potassium: 4.4 mmol/L (ref 3.5–5.3)
Sodium: 138 mmol/L (ref 135–146)
Total Bilirubin: 0.3 mg/dL (ref 0.2–1.2)
Total Protein: 7.6 g/dL (ref 6.1–8.1)
eGFR: 127 mL/min/{1.73_m2} (ref >=60)

## 2024-01-13 LAB — CBC
HCT: 48.5 % (ref 38.5–50.0)
Hemoglobin: 16.5 g/dL (ref 13.2–17.1)
MCH: 27.8 pg (ref 27.0–33.0)
MCHC: 34 g/dL (ref 32.0–36.0)
MCV: 81.8 fL (ref 80.0–100.0)
MPV: 10 fL (ref 7.5–12.5)
Platelets: 322 10*3/uL (ref 140–400)
RBC: 5.93 10*6/uL — ABNORMAL HIGH (ref 4.20–5.80)
RDW: 13.6 % (ref 11.0–15.0)
WBC: 10.3 10*3/uL (ref 3.8–10.8)

## 2024-01-13 NOTE — Progress Notes (Signed)
LMOVM for patient to call back.

## 2024-08-18 ENCOUNTER — Ambulatory Visit: Payer: Self-pay | Admitting: Neurology

## 2025-02-21 ENCOUNTER — Ambulatory Visit: Payer: Self-pay | Admitting: Neurology
